# Patient Record
Sex: Female | Born: 1940 | Race: White | Hispanic: No | Marital: Married | Smoking: Never smoker
Health system: Southern US, Community
[De-identification: ages and names within clinical notes are randomized; demographics above are authoritative.]

## PROBLEM LIST (undated history)

## (undated) DIAGNOSIS — C50919 Malignant neoplasm of unspecified site of unspecified female breast: Secondary | ICD-10-CM

## (undated) DIAGNOSIS — E119 Type 2 diabetes mellitus without complications: Secondary | ICD-10-CM

## (undated) DIAGNOSIS — Z87442 Personal history of urinary calculi: Secondary | ICD-10-CM

## (undated) DIAGNOSIS — C801 Malignant (primary) neoplasm, unspecified: Secondary | ICD-10-CM

## (undated) DIAGNOSIS — K219 Gastro-esophageal reflux disease without esophagitis: Secondary | ICD-10-CM

## (undated) DIAGNOSIS — F419 Anxiety disorder, unspecified: Secondary | ICD-10-CM

## (undated) DIAGNOSIS — I10 Essential (primary) hypertension: Secondary | ICD-10-CM

## (undated) DIAGNOSIS — Z923 Personal history of irradiation: Secondary | ICD-10-CM

## (undated) DIAGNOSIS — R0600 Dyspnea, unspecified: Secondary | ICD-10-CM

## (undated) HISTORY — DX: Gastro-esophageal reflux disease without esophagitis: K21.9

## (undated) HISTORY — DX: Type 2 diabetes mellitus without complications: E11.9

## (undated) HISTORY — DX: Anxiety disorder, unspecified: F41.9

## (undated) HISTORY — PX: SKIN CANCER EXCISION: SHX779

## (undated) HISTORY — DX: Essential (primary) hypertension: I10

## (undated) HISTORY — PX: ABDOMINAL HYSTERECTOMY: SHX81

## (undated) HISTORY — PX: COLONOSCOPY WITH PROPOFOL: SHX5780

## (undated) HISTORY — PX: EYE SURGERY: SHX253

## (undated) HISTORY — PX: ACHILLES TENDON REPAIR: SUR1153

---
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
## 2003-03-01 ENCOUNTER — Other Ambulatory Visit: Payer: Self-pay

## 2005-04-14 ENCOUNTER — Ambulatory Visit: Payer: Self-pay | Admitting: Family Medicine

## 2005-07-07 ENCOUNTER — Ambulatory Visit: Payer: Self-pay | Admitting: Gastroenterology

## 2006-06-17 ENCOUNTER — Ambulatory Visit: Payer: Self-pay | Admitting: Family Medicine

## 2007-06-21 ENCOUNTER — Ambulatory Visit: Payer: Self-pay | Admitting: Family Medicine

## 2007-08-16 ENCOUNTER — Other Ambulatory Visit: Payer: Self-pay

## 2007-08-16 ENCOUNTER — Ambulatory Visit: Payer: Self-pay | Admitting: Ophthalmology

## 2007-08-29 ENCOUNTER — Ambulatory Visit: Payer: Self-pay | Admitting: Ophthalmology

## 2008-08-07 ENCOUNTER — Ambulatory Visit: Payer: Self-pay | Admitting: Family Medicine

## 2008-11-05 ENCOUNTER — Ambulatory Visit: Payer: Self-pay | Admitting: Gastroenterology

## 2010-07-01 ENCOUNTER — Ambulatory Visit: Payer: Self-pay | Admitting: Family Medicine

## 2011-07-22 ENCOUNTER — Ambulatory Visit: Payer: Self-pay | Admitting: Family Medicine

## 2012-10-03 ENCOUNTER — Ambulatory Visit: Payer: Self-pay | Admitting: Family Medicine

## 2012-10-06 ENCOUNTER — Ambulatory Visit: Payer: Self-pay | Admitting: Family Medicine

## 2012-11-02 ENCOUNTER — Ambulatory Visit: Payer: Self-pay | Admitting: Cardiology

## 2012-11-02 DIAGNOSIS — Z9889 Other specified postprocedural states: Secondary | ICD-10-CM | POA: Insufficient documentation

## 2013-02-21 ENCOUNTER — Ambulatory Visit: Payer: Self-pay | Admitting: Ophthalmology

## 2013-03-06 ENCOUNTER — Ambulatory Visit: Payer: Self-pay | Admitting: Ophthalmology

## 2013-10-26 ENCOUNTER — Ambulatory Visit: Payer: Self-pay | Admitting: Family Medicine

## 2013-11-17 DIAGNOSIS — Z86010 Personal history of colonic polyps: Secondary | ICD-10-CM | POA: Insufficient documentation

## 2013-12-04 ENCOUNTER — Ambulatory Visit: Payer: Self-pay | Admitting: Gastroenterology

## 2013-12-07 LAB — PATHOLOGY REPORT

## 2014-06-30 NOTE — Op Note (Signed)
PATIENT NAME:  Susan Montgomery, Susan Montgomery MR#:  [id number] DATE OF BIRTH:  1940-04-14  DATE OF PROCEDURE:  03/06/2013  PREOPERATIVE DIAGNOSIS: Cataract, left eye.   POSTOPERATIVE DIAGNOSIS: Cataract, left eye.   PROCEDURE PERFORMED: Extracapsular cataract extraction using phacoemulsification with placement of Alcon [id number], 14.5-diopter posterior chamber lens, serial number [id number].   SURGEON: Loura Back. Latroya Ng, M.D.   ANESTHESIA: Lidocaine 4% and 0.75% Marcaine, a 50-50 mixture with 10 units/mL of Hylenex added given as a peribulbar.   ANESTHESIOLOGIST: Dr. Boston Service.   COMPLICATIONS: None.   ESTIMATED BLOOD LOSS: Less than 1 mL.   DESCRIPTION OF PROCEDURE:  The patient was brought to the operating room and given a peribulbar block.  The patient was then prepped and draped in the usual fashion.  The vertical rectus muscles were imbricated using 5-0 silk sutures.  These sutures were then clamped to the sterile drapes as bridle sutures.  A limbal peritomy was performed extending two clock hours and hemostasis was obtained with cautery.  A partial thickness scleral groove was made at the surgical limbus and dissected anteriorly in a lamellar dissection using an Alcon crescent knife.  The anterior chamber was entered supero-temporally with a Superblade and through the lamellar dissection with a 2.6 mm keratome.  DisCoVisc was used to replace the aqueous and a continuous tear capsulorrhexis was carried out.  Hydrodissection and hydrodelineation were carried out with balanced salt and a 27 gauge canula.  The nucleus was rotated to confirm the effectiveness of the hydrodissection.  Phacoemulsification was carried out using a divide-and-conquer technique.  Total ultrasound time was 1 minute and 27.2 seconds with an average power of 25.5%.  CDE 36.98.    Irrigation/aspiration was used to remove the residual cortex.  DisCoVisc was used to inflate the capsule and the internal incision was enlarged to 3  mm with the crescent knife.  The intraocular lens was folded and inserted into the capsular bag using the AcrySert delivery system.  Irrigation/aspiration was used to remove the residual DisCoVisc.  Miostat was injected into the anterior chamber through the paracentesis track to inflate the anterior chamber and induce miosis.  The wound was checked for leaks and none were found. The conjunctiva was closed with cautery and the bridle sutures were removed.  Two drops of 0.3% Vigamox were placed on the eye.   An eye shield was placed on the eye.  The patient was discharged to the recovery room in good condition.  ____________________________ Loura Back Jaque Dacy, MD sad:cs D: 03/06/2013 13:32:37 ET T: 03/06/2013 14:49:18 ET JOB#: [id number]  cc: Remo Lipps A. Tawnia Schirm, MD, <Dictator> Martie Lee MD ELECTRONICALLY SIGNED 03/13/2013 11:54

## 2014-10-12 ENCOUNTER — Other Ambulatory Visit: Payer: Self-pay | Admitting: Family Medicine

## 2014-10-12 DIAGNOSIS — Z1231 Encounter for screening mammogram for malignant neoplasm of breast: Secondary | ICD-10-CM

## 2014-10-29 ENCOUNTER — Ambulatory Visit
Admission: RE | Admit: 2014-10-29 | Discharge: 2014-10-29 | Disposition: A | Discharge status: Home / Self Care | Payer: Medicare Other | Source: Ambulatory Visit | Attending: Family Medicine | Admitting: Family Medicine

## 2014-10-29 DIAGNOSIS — Z1231 Encounter for screening mammogram for malignant neoplasm of breast: Secondary | ICD-10-CM | POA: Insufficient documentation

## 2014-10-29 HISTORY — DX: Malignant (primary) neoplasm, unspecified: C80.1

## 2015-04-15 DIAGNOSIS — I10 Essential (primary) hypertension: Secondary | ICD-10-CM | POA: Insufficient documentation

## 2015-04-15 DIAGNOSIS — R002 Palpitations: Secondary | ICD-10-CM | POA: Insufficient documentation

## 2015-04-15 DIAGNOSIS — R0602 Shortness of breath: Secondary | ICD-10-CM | POA: Insufficient documentation

## 2015-09-18 ENCOUNTER — Other Ambulatory Visit: Payer: Self-pay | Admitting: Family Medicine

## 2015-09-18 DIAGNOSIS — Z1231 Encounter for screening mammogram for malignant neoplasm of breast: Secondary | ICD-10-CM

## 2015-10-30 ENCOUNTER — Other Ambulatory Visit: Payer: Self-pay | Admitting: Family Medicine

## 2015-10-30 ENCOUNTER — Ambulatory Visit
Admission: RE | Admit: 2015-10-30 | Discharge: 2015-10-30 | Disposition: A | Discharge status: Home / Self Care | Payer: Medicare HMO | Source: Ambulatory Visit | Attending: Family Medicine | Admitting: Family Medicine

## 2015-10-30 DIAGNOSIS — Z1231 Encounter for screening mammogram for malignant neoplasm of breast: Secondary | ICD-10-CM

## 2016-10-19 ENCOUNTER — Other Ambulatory Visit: Payer: Self-pay | Admitting: Family Medicine

## 2016-10-19 DIAGNOSIS — N6320 Unspecified lump in the left breast, unspecified quadrant: Principal | ICD-10-CM

## 2016-10-19 DIAGNOSIS — N6325 Unspecified lump in the left breast, overlapping quadrants: Secondary | ICD-10-CM

## 2016-10-29 ENCOUNTER — Ambulatory Visit
Admission: RE | Admit: 2016-10-29 | Discharge: 2016-10-29 | Disposition: A | Discharge status: Home / Self Care | Payer: Medicare HMO | Source: Ambulatory Visit | Attending: Family Medicine | Admitting: Family Medicine

## 2016-10-29 DIAGNOSIS — N6320 Unspecified lump in the left breast, unspecified quadrant: Secondary | ICD-10-CM | POA: Diagnosis not present

## 2016-10-29 DIAGNOSIS — N6325 Unspecified lump in the left breast, overlapping quadrants: Secondary | ICD-10-CM

## 2017-04-21 DIAGNOSIS — N393 Stress incontinence (female) (male): Secondary | ICD-10-CM | POA: Diagnosis not present

## 2017-04-21 DIAGNOSIS — E119 Type 2 diabetes mellitus without complications: Secondary | ICD-10-CM | POA: Diagnosis not present

## 2017-04-21 DIAGNOSIS — R5381 Other malaise: Secondary | ICD-10-CM | POA: Diagnosis not present

## 2017-04-21 DIAGNOSIS — I10 Essential (primary) hypertension: Secondary | ICD-10-CM | POA: Diagnosis not present

## 2017-04-21 DIAGNOSIS — R5383 Other fatigue: Secondary | ICD-10-CM | POA: Diagnosis not present

## 2017-04-21 DIAGNOSIS — R300 Dysuria: Secondary | ICD-10-CM | POA: Diagnosis not present

## 2017-04-21 DIAGNOSIS — L989 Disorder of the skin and subcutaneous tissue, unspecified: Secondary | ICD-10-CM | POA: Diagnosis not present

## 2017-06-07 DIAGNOSIS — L538 Other specified erythematous conditions: Secondary | ICD-10-CM | POA: Diagnosis not present

## 2017-06-07 DIAGNOSIS — X32XXXA Exposure to sunlight, initial encounter: Secondary | ICD-10-CM | POA: Diagnosis not present

## 2017-06-07 DIAGNOSIS — L817 Pigmented purpuric dermatosis: Secondary | ICD-10-CM | POA: Diagnosis not present

## 2017-06-07 DIAGNOSIS — L570 Actinic keratosis: Secondary | ICD-10-CM | POA: Diagnosis not present

## 2017-06-07 DIAGNOSIS — L820 Inflamed seborrheic keratosis: Secondary | ICD-10-CM | POA: Diagnosis not present

## 2017-07-12 DIAGNOSIS — H401131 Primary open-angle glaucoma, bilateral, mild stage: Secondary | ICD-10-CM | POA: Diagnosis not present

## 2017-07-19 DIAGNOSIS — H401131 Primary open-angle glaucoma, bilateral, mild stage: Secondary | ICD-10-CM | POA: Diagnosis not present

## 2017-10-11 DIAGNOSIS — E119 Type 2 diabetes mellitus without complications: Secondary | ICD-10-CM | POA: Diagnosis not present

## 2017-10-11 DIAGNOSIS — I10 Essential (primary) hypertension: Secondary | ICD-10-CM | POA: Diagnosis not present

## 2017-10-21 DIAGNOSIS — M25512 Pain in left shoulder: Secondary | ICD-10-CM | POA: Diagnosis not present

## 2017-10-21 DIAGNOSIS — E119 Type 2 diabetes mellitus without complications: Secondary | ICD-10-CM | POA: Diagnosis not present

## 2017-10-21 DIAGNOSIS — I10 Essential (primary) hypertension: Secondary | ICD-10-CM | POA: Diagnosis not present

## 2017-10-21 DIAGNOSIS — Z0000 Encounter for general adult medical examination without abnormal findings: Secondary | ICD-10-CM | POA: Diagnosis not present

## 2017-10-22 ENCOUNTER — Other Ambulatory Visit: Payer: Self-pay | Admitting: Family Medicine

## 2017-10-22 DIAGNOSIS — Z1231 Encounter for screening mammogram for malignant neoplasm of breast: Secondary | ICD-10-CM

## 2017-10-29 DIAGNOSIS — D2271 Melanocytic nevi of right lower limb, including hip: Secondary | ICD-10-CM | POA: Diagnosis not present

## 2017-10-29 DIAGNOSIS — D2262 Melanocytic nevi of left upper limb, including shoulder: Secondary | ICD-10-CM | POA: Diagnosis not present

## 2017-10-29 DIAGNOSIS — Z08 Encounter for follow-up examination after completed treatment for malignant neoplasm: Secondary | ICD-10-CM | POA: Diagnosis not present

## 2017-10-29 DIAGNOSIS — D225 Melanocytic nevi of trunk: Secondary | ICD-10-CM | POA: Diagnosis not present

## 2017-10-29 DIAGNOSIS — D2272 Melanocytic nevi of left lower limb, including hip: Secondary | ICD-10-CM | POA: Diagnosis not present

## 2017-10-29 DIAGNOSIS — L821 Other seborrheic keratosis: Secondary | ICD-10-CM | POA: Diagnosis not present

## 2017-10-29 DIAGNOSIS — Z85828 Personal history of other malignant neoplasm of skin: Secondary | ICD-10-CM | POA: Diagnosis not present

## 2017-10-29 DIAGNOSIS — D2261 Melanocytic nevi of right upper limb, including shoulder: Secondary | ICD-10-CM | POA: Diagnosis not present

## 2017-11-10 ENCOUNTER — Encounter: Payer: Self-pay | Admitting: Radiology

## 2017-11-10 ENCOUNTER — Ambulatory Visit
Admission: RE | Admit: 2017-11-10 | Discharge: 2017-11-10 | Disposition: A | Discharge status: Home / Self Care | Payer: Medicare HMO | Source: Ambulatory Visit | Attending: Family Medicine | Admitting: Family Medicine

## 2017-11-10 DIAGNOSIS — Z1231 Encounter for screening mammogram for malignant neoplasm of breast: Secondary | ICD-10-CM | POA: Insufficient documentation

## 2017-11-15 ENCOUNTER — Other Ambulatory Visit: Payer: Self-pay | Admitting: Family Medicine

## 2017-11-15 DIAGNOSIS — R921 Mammographic calcification found on diagnostic imaging of breast: Secondary | ICD-10-CM

## 2017-11-15 DIAGNOSIS — R928 Other abnormal and inconclusive findings on diagnostic imaging of breast: Secondary | ICD-10-CM

## 2017-11-23 ENCOUNTER — Other Ambulatory Visit: Payer: Medicare Other

## 2017-11-23 ENCOUNTER — Ambulatory Visit
Admission: RE | Admit: 2017-11-23 | Discharge: 2017-11-23 | Disposition: A | Discharge status: Home / Self Care | Payer: Medicare Other | Source: Ambulatory Visit | Attending: Family Medicine | Admitting: Family Medicine

## 2017-12-01 ENCOUNTER — Ambulatory Visit
Admission: RE | Admit: 2017-12-01 | Discharge: 2017-12-01 | Disposition: A | Discharge status: Home / Self Care | Payer: Medicare HMO | Source: Ambulatory Visit | Attending: Family Medicine | Admitting: Family Medicine

## 2017-12-01 DIAGNOSIS — R928 Other abnormal and inconclusive findings on diagnostic imaging of breast: Secondary | ICD-10-CM | POA: Diagnosis not present

## 2017-12-01 DIAGNOSIS — R921 Mammographic calcification found on diagnostic imaging of breast: Secondary | ICD-10-CM | POA: Insufficient documentation

## 2017-12-02 ENCOUNTER — Other Ambulatory Visit: Payer: Self-pay | Admitting: Family Medicine

## 2017-12-02 DIAGNOSIS — R928 Other abnormal and inconclusive findings on diagnostic imaging of breast: Secondary | ICD-10-CM

## 2017-12-02 DIAGNOSIS — R921 Mammographic calcification found on diagnostic imaging of breast: Secondary | ICD-10-CM

## 2017-12-07 DIAGNOSIS — C50919 Malignant neoplasm of unspecified site of unspecified female breast: Secondary | ICD-10-CM

## 2017-12-07 HISTORY — DX: Malignant neoplasm of unspecified site of unspecified female breast: C50.919

## 2017-12-09 ENCOUNTER — Ambulatory Visit
Admission: RE | Admit: 2017-12-09 | Discharge: 2017-12-09 | Disposition: A | Discharge status: Home / Self Care | Payer: Medicare HMO | Source: Ambulatory Visit | Attending: Family Medicine | Admitting: Family Medicine

## 2017-12-09 DIAGNOSIS — D0511 Intraductal carcinoma in situ of right breast: Secondary | ICD-10-CM | POA: Diagnosis not present

## 2017-12-09 DIAGNOSIS — R921 Mammographic calcification found on diagnostic imaging of breast: Secondary | ICD-10-CM | POA: Insufficient documentation

## 2017-12-09 DIAGNOSIS — R928 Other abnormal and inconclusive findings on diagnostic imaging of breast: Secondary | ICD-10-CM

## 2017-12-09 HISTORY — PX: BREAST BIOPSY: SHX20

## 2017-12-10 ENCOUNTER — Other Ambulatory Visit: Payer: Self-pay | Admitting: Family Medicine

## 2017-12-13 LAB — SURGICAL PATHOLOGY

## 2017-12-14 ENCOUNTER — Other Ambulatory Visit: Payer: Self-pay

## 2017-12-14 DIAGNOSIS — D0511 Intraductal carcinoma in situ of right breast: Secondary | ICD-10-CM

## 2017-12-14 NOTE — Progress Notes (Signed)
  Oncology Nurse Navigator Documentation  Navigator Location: [facility] (12/14/17 1600) Referral date to RadOnc/MedOnc: 12/21/17 (12/14/17 1600) )Navigator Encounter Type: Introductory phone call (12/14/17 1600)   Abnormal Finding Date: 12/01/17 (12/14/17 1600) Confirmed Diagnosis Date: 12/09/17 (12/14/17 1600)               Patient Visit Type: Initial (12/14/17 1600) Treatment Phase: Pre-Tx/Tx Discussion (12/14/17 1600) Barriers/Navigation Needs: Education;Coordination of Care (12/14/17 1600) Education: Accessing Care/ Finding Providers;Coping with Diagnosis/ Prognosis;Newly Diagnosed Cancer Education (12/14/17 1600) Interventions: Coordination of Care;Education (12/14/17 1600)   Coordination of Care: Appts (12/14/17 1600)                  Time Spent with Patient: 60 (12/14/17 1600)   Introduced to [employer].  To give Breast Cancer Treatment Handbook/folder with hospital services at initial consult with Dr. Janese Banks on 12/21/17.  Patient is also scheduled for surgical consult with Dr. Peyton Najjar on 12/21/17 a 1:30.

## 2017-12-16 ENCOUNTER — Other Ambulatory Visit: Payer: Self-pay

## 2017-12-21 ENCOUNTER — Ambulatory Visit: Payer: Self-pay | Admitting: General Surgery

## 2017-12-21 ENCOUNTER — Other Ambulatory Visit: Payer: Self-pay | Admitting: General Surgery

## 2017-12-21 ENCOUNTER — Inpatient Hospital Stay: Payer: Medicare HMO | Attending: Oncology | Admitting: Oncology

## 2017-12-21 ENCOUNTER — Encounter (INDEPENDENT_AMBULATORY_CARE_PROVIDER_SITE_OTHER): Payer: Self-pay

## 2017-12-21 ENCOUNTER — Encounter: Payer: Self-pay | Admitting: Oncology

## 2017-12-21 VITALS — BP 123/78 | HR 76 | Temp 97.8°F | Resp 18 | Ht 64.0 in | Wt 189.9 lb

## 2017-12-21 DIAGNOSIS — D0511 Intraductal carcinoma in situ of right breast: Secondary | ICD-10-CM | POA: Diagnosis not present

## 2017-12-21 DIAGNOSIS — Z803 Family history of malignant neoplasm of breast: Secondary | ICD-10-CM | POA: Insufficient documentation

## 2017-12-21 DIAGNOSIS — I10 Essential (primary) hypertension: Secondary | ICD-10-CM | POA: Diagnosis not present

## 2017-12-21 DIAGNOSIS — Z7189 Other specified counseling: Secondary | ICD-10-CM

## 2017-12-21 DIAGNOSIS — M8580 Other specified disorders of bone density and structure, unspecified site: Secondary | ICD-10-CM | POA: Diagnosis not present

## 2017-12-21 DIAGNOSIS — N649 Disorder of breast, unspecified: Secondary | ICD-10-CM | POA: Diagnosis not present

## 2017-12-21 NOTE — Progress Notes (Signed)
  Oncology Nurse Navigator Documentation  Navigator Location: [facility] (12/21/17 1500) Referral date to RadOnc/MedOnc: 12/21/17 (12/21/17 1500) )Navigator Encounter Type: Clinic/MDC (12/21/17 1500)       Surgery Date: 12/29/17 (12/21/17 1500)             Patient Visit Type: Follow-up (12/21/17 1500) Treatment Phase: Pre-Tx/Tx Discussion (12/21/17 1500) Barriers/Navigation Needs: Education;Coordination of Care (12/21/17 1500) Education: Newly Diagnosed Cancer Education;Coping with Diagnosis/ Prognosis;Understanding Cancer/ Treatment Options (12/21/17 1500)                        Time Spent with Patient: 60 (12/21/17 1500)   Supported patient at initial Med/Onc visit. States she is very anxious.  Dr. Janese Banks alleviated anxiety with discussion of treatment plan.  Patient saw Dr. Peyton Najjar this afternoon and she is scheduled for surgery on 12/29/17.

## 2017-12-21 NOTE — H&P (View-Only) (Signed)
PATIENT PROFILE: Susan Montgomery is a 77 y.o. female who presents to the Clinic for consultation at the request of Dr. Hedrick for evaluation of breast cancer.  PCP:  Hedrick, James F, MD  HISTORY OF PRESENT ILLNESS: Susan Montgomery reports had her regular screening mammogram. She denies having breast pain, skin changes, nipple retraction or secretions.   Patient had screening mammogram on 11/10/17 and was found with suspicious calcifications. This was again demonstrated and confirmed in the diagnostic mammogram on 12/01/17. Biopsy with clip placement was done on 12/09/17. 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   Family history of breast cancer: Aunt from father side Family history of other cancers: Brother with liver cancer Menarche: 14 years old Menopause: When she was in the 40's Used OCP: yes for 12 years Number of pregnancies: 3 Used estrogen and progesterone therapy: Premarin for several years  History of Radiation to the chest: None  PROBLEM LIST:         Problem List  Date Reviewed: 10/21/2017         Noted   SOB (shortness of breath) on exertion 04/15/2015   Essential hypertension 04/15/2015   Heart palpitations 04/15/2015   Hx of adenomatous colonic polyps 11/17/2013   Osteopenia Unknown   H/O cardiac catheterization 11/02/2012   Overview    Insignificant CAD         GENERAL REVIEW OF SYSTEMS:   General ROS: negative for - chills, fatigue, fever, weight gain or weight loss Allergy and Immunology ROS: negative for - hives  Hematological and Lymphatic ROS: negative for - bleeding problems or bruising, negative for palpable nodes Endocrine ROS: negative for - heat or cold intolerance, hair changes Respiratory ROS: negative for - cough, or wheezing. Positive for shortness of breath.  Cardiovascular ROS: no chest pain or palpitations GI ROS: negative for nausea, vomiting, abdominal pain, diarrhea. Positive for  constipation Musculoskeletal ROS: negative for - joint swelling or muscle pain. Positive for joint stiffness Neurological ROS: negative for - confusion, syncope Dermatological ROS: negative for pruritus and rash Psychiatric: negative for depression, difficulty sleeping and memory loss. Positive for anxiety.   MEDICATIONS: CurrentMedications        Current Outpatient Medications  Medication Sig Dispense Refill  . citalopram (CELEXA) 20 MG tablet TAKE 1 TABLET BY MOUTH ONCE DAILY 90 tablet 2  . fluticasone (FLONASE) 50 mcg/actuation nasal spray Place 2 sprays into both nostrils once daily 16 g 1  . latanoprost (XALATAN) 0.005 % ophthalmic solution 1 drop nightly.    . meloxicam (MOBIC) 15 MG tablet Take 1 tablet (15 mg total) by mouth once daily 30 tablet 0  . metoprolol succinate (TOPROL-XL) 25 MG XL tablet TAKE ONE TABLET BY MOUTH ONCE DAILY 90 tablet 3  . omeprazole (PRILOSEC) 20 MG DR capsule TAKE ONE CAPSULE BY MOUTH ONCE DAILY 90 capsule 3  . triamterene-hydrochlorothiazide (MAXZIDE) 75-50 mg tablet TAKE 1 TABLET BY MOUTH ONCE DAILY 90 tablet 3   No current facility-administered medications for this visit.       ALLERGIES: Codeine phosphate; Penicillin g pot in dextrose; and Sulfa (sulfonamide antibiotics)  PAST MEDICAL HISTORY:     Past Medical History:  Diagnosis Date  . Arthritis   . Breast cancer (CMS-HCC) 12/2017  . Colon polyp 12/04/13   TUBULAR ADENOMA AND HYPERPLASTIC  . Diverticulosis 12/04/13  . GERD (gastroesophageal reflux disease)   . Glaucoma (increased eye pressure)    Dr. Dingeldein  . Hypertension   .   Osteopenia     PAST SURGICAL HISTORY:      Past Surgical History:  Procedure Laterality Date  . CATARACT EXTRACTION     right 2011, left 2014  . COLONOSCOPY  1999  . COLONOSCOPY  06/11/1998  . COLONOSCOPY  04/28/2001  . COLONOSCOPY  07/07/2005  . COLONOSCOPY  11/05/2008  . COLONOSCOPY  12/04/13   repeat 5 years per mus   . HYSTERECTOMY  1986   for bleeding     FAMILY HISTORY:      Family History  Problem Relation Age of Onset  . Diabetes type II Mother   . Myocardial Infarction (Heart attack) Mother   . High blood pressure (Hypertension) Mother   . High blood pressure (Hypertension) Sister   . Kidney cancer Brother   . Diabetes type II Sister   . Colon cancer Neg Hx   . Colon polyps Neg Hx   . Rectal cancer Neg Hx   . Ulcers Neg Hx      SOCIAL HISTORY: Social History          Socioeconomic History  . Marital status: Married    Spouse name: Not on file  . Number of children: Not on file  . Years of education: Not on file  . Highest education level: Not on file  Occupational History  . Not on file  Social Needs  . Financial resource strain: Not on file  . Food insecurity:    Worry: Not on file    Inability: Not on file  . Transportation needs:    Medical: Not on file    Non-medical: Not on file  Tobacco Use  . Smoking status: Never Smoker  . Smokeless tobacco: Never Used  Substance and Sexual Activity  . Alcohol use: No    Alcohol/week: 0.0 standard drinks  . Drug use: No  . Sexual activity: Defer  Other Topics Concern  . Not on file  Social History Narrative   She is married, has three children age 39 to 41. Occupation:  sewing supervisor.  No tobacco or alcohol. No illicit drugs.      PHYSICAL EXAM:    Vitals:   12/21/17 1314  BP: 125/73  Pulse: 72  Temp: 36.4 C (97.5 F)    Body mass index is 31.94 kg/m. Weight: 84.4 kg (186 lb 1.1 oz)   GENERAL: Alert, active, oriented x3  HEENT: Pupils equal reactive to light. Extraocular movements are intact. Sclera clear. Palpebral conjunctiva normal red color.Pharynx clear.  NECK: Supple with no palpable mass and no adenopathy.  LUNGS: Sound clear with no rales rhonchi or wheezes.  HEART: Regular rhythm S1 and S2 without murmur.  BREAST: right breast normal without mass,  skin or nipple changes or axillary nodes. There is a 1.5 cm skin lesion, pedunculated on the lower inner area. Small bruise on lower outer area from biopsy site. Left breast normal without mass, skin or nipple changes or axillary nodes.  ABDOMEN: Soft and depressible, nontender with no palpable mass, no hepatomegaly.  EXTREMITIES: Well-developed well-nourished symmetrical with no dependent edema.  NEUROLOGICAL: Awake alert oriented, facial expression symmetrical, moving all extremities.  REVIEW OF DATA: I have reviewed the following data today:      Appointment on 10/11/2017  Component Date Value  . WBC (White Blood Cell Co* 10/11/2017 8.9   . RBC (Red Blood Cell Coun* 10/11/2017 4.95   . Hemoglobin 10/11/2017 13.3   . Hematocrit 10/11/2017 41.6   . MCV (Mean Corpuscular Vo* 10/11/2017   84.0   . MCH (Mean Corpuscular He* 10/11/2017 26.9*  . MCHC (Mean Corpuscular H* 10/11/2017 32.0   . Platelet Count 10/11/2017 343   . RDW-CV (Red Cell Distrib* 10/11/2017 13.0   . MPV (Mean Platelet Volum* 10/11/2017 10.4   . Neutrophils 10/11/2017 5.50   . Lymphocytes 10/11/2017 2.60   . Mixed Count 10/11/2017 0.80   . Neutrophil % 10/11/2017 62.2   . Lymphocyte % 10/11/2017 29.2   . Mixed % 10/11/2017 8.6   . Glucose 10/11/2017 157*  . Sodium 10/11/2017 141   . Potassium 10/11/2017 3.7   . Chloride 10/11/2017 100   . Carbon Dioxide (CO2) 10/11/2017 31.9   . Urea Nitrogen (BUN) 10/11/2017 29*  . Creatinine 10/11/2017 0.9   . Glomerular Filtration Ra* 10/11/2017 61   . Calcium 10/11/2017 9.6   . AST  10/11/2017 13   . ALT  10/11/2017 10   . Alk Phos (alkaline Phosp* 10/11/2017 62   . Albumin 10/11/2017 4.1   . Bilirubin, Total 10/11/2017 0.7   . Protein, Total 10/11/2017 7.4   . A/G Ratio 10/11/2017 1.2   . Cholesterol, Total 10/11/2017 203*  . Triglyceride 10/11/2017 125   . HDL (High Density Lipopr* 10/11/2017 38.3   . LDL (Low Density Lipopro* 10/11/2017 140*  . VLDL Cholesterol  10/11/2017 25   . Cholesterol/HDL Ratio 10/11/2017 5.3   . Color 10/11/2017 Yellow   . Clarity 10/11/2017 SL Cloudy*  . Specific Gravity 10/11/2017 1.015   . pH, Urine 10/11/2017 5.5   . Protein, Urinalysis 10/11/2017 Negative   . Glucose, Urinalysis 10/11/2017 Negative   . Ketones, Urinalysis 10/11/2017 Negative   . Blood, Urinalysis 10/11/2017 Negative   . Nitrite, Urinalysis 10/11/2017 Negative   . Leukocyte Esterase, Urin* 10/11/2017 Negative   . White Blood Cells, Urina* 10/11/2017 0-3   . Red Blood Cells, Urinaly* 10/11/2017 0-3   . Bacteria, Urinalysis 10/11/2017 Many*  . Squamous Epithelial Cell* 10/11/2017 Many*  . Hemoglobin A1C 10/11/2017 7.3*  . Average Blood Glucose (C* 10/11/2017 163     I personally reviewed the images screening, diagnostic and post clip placement mammograms identifying the cluster of calcifications with a span of 21 x 6 mm on the right breast.  ASSESSMENT: Susan Montgomery is a 77 y.o. female presenting for consultation for right breast cancer.    Patient was oriented again about the pathology results. Report showing DCIS discussed with patient. Surgical alternatives were discussed with patient including partial vs total mastectomy. Surgical technique and post operative care was discussed with patient. Risk of surgery was discussed with patient including but not limited to: wound infection, seroma, hematoma, brachial plexopathy, mondor's disease (thrombosis of small veins of breast), chronic wound pain, breast lymphedema, altered sensation to the nipple and cosmesis among others.   PLAN: 1. Needle guided right partial mastectomy (19301), Excision of right breast skin lesion (11402) 2. CBC, CMP done on 10/11/17 3. Internal Medicine clearance (Dr. Kary Kos).  4. Avoid aspirin 5 days before surgery.  5. Contact us if has any question or concern.   Patient and her husband verbalized understanding, all questions were answered, and were agreeable with the plan  outlined above.     Herbert Pun, MD

## 2017-12-21 NOTE — H&P (Signed)
PATIENT PROFILE: Susan Montgomery is a 77 y.o. female who presents to the Clinic for consultation at the request of Dr. Kary Montgomery for evaluation of breast cancer.  PCP:  Susan Macadamia, MD  HISTORY OF PRESENT ILLNESS: Susan Montgomery reports had her regular screening mammogram. She denies having breast pain, skin changes, nipple retraction or secretions.   Patient had screening mammogram on 11/10/17 and was found with suspicious calcifications. This was again demonstrated and confirmed in the diagnostic mammogram on 12/01/17. Biopsy with clip placement was done on 12/09/17. 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   Family history of breast cancer: Aunt from father side Family history of other cancers: Brother with liver cancer Menarche: 65 years old Menopause: When she was in the 56's Used OCP: yes for 12 years Number of pregnancies: 3 Used estrogen and progesterone therapy: Premarin for several years  History of Radiation to the chest: None  PROBLEM LIST:         Problem List  Date Reviewed: 10/21/2017         Noted   SOB (shortness of breath) on exertion 04/15/2015   Essential hypertension 04/15/2015   Heart palpitations 04/15/2015   Hx of adenomatous colonic polyps 11/17/2013   Osteopenia Unknown   H/O cardiac catheterization 11/02/2012   Overview    Insignificant CAD         GENERAL REVIEW OF SYSTEMS:   General ROS: negative for - chills, fatigue, fever, weight gain or weight loss Allergy and Immunology ROS: negative for - hives  Hematological and Lymphatic ROS: negative for - bleeding problems or bruising, negative for palpable nodes Endocrine ROS: negative for - heat or cold intolerance, hair changes Respiratory ROS: negative for - cough, or wheezing. Positive for shortness of breath.  Cardiovascular ROS: no chest pain or palpitations GI ROS: negative for nausea, vomiting, abdominal pain, diarrhea. Positive for  constipation Musculoskeletal ROS: negative for - joint swelling or muscle pain. Positive for joint stiffness Neurological ROS: negative for - confusion, syncope Dermatological ROS: negative for pruritus and rash Psychiatric: negative for depression, difficulty sleeping and memory loss. Positive for anxiety.   MEDICATIONS: CurrentMedications        Current Outpatient Medications  Medication Sig Dispense Refill  . citalopram (CELEXA) 20 MG tablet TAKE 1 TABLET BY MOUTH ONCE DAILY 90 tablet 2  . fluticasone (FLONASE) 50 mcg/actuation nasal spray Place 2 sprays into both nostrils once daily 16 g 1  . latanoprost (XALATAN) 0.005 % ophthalmic solution 1 drop nightly.    . meloxicam (MOBIC) 15 MG tablet Take 1 tablet (15 mg total) by mouth once daily 30 tablet 0  . metoprolol succinate (TOPROL-XL) 25 MG XL tablet TAKE ONE TABLET BY MOUTH ONCE DAILY 90 tablet 3  . omeprazole (PRILOSEC) 20 MG DR capsule TAKE ONE CAPSULE BY MOUTH ONCE DAILY 90 capsule 3  . triamterene-hydrochlorothiazide (MAXZIDE) 75-50 mg tablet TAKE 1 TABLET BY MOUTH ONCE DAILY 90 tablet 3   No current facility-administered medications for this visit.       ALLERGIES: Codeine phosphate; Penicillin g pot in dextrose; and Sulfa (sulfonamide antibiotics)  PAST MEDICAL HISTORY:     Past Medical History:  Diagnosis Date  . Arthritis   . Breast cancer (CMS-HCC) 12/2017  . Colon polyp 12/04/13   TUBULAR ADENOMA AND HYPERPLASTIC  . Diverticulosis 12/04/13  . GERD (gastroesophageal reflux disease)   . Glaucoma (increased eye pressure)    Dr. Sandra Montgomery  . Hypertension   .  Osteopenia     PAST SURGICAL HISTORY:      Past Surgical History:  Procedure Laterality Date  . CATARACT EXTRACTION     right 2011, left 2014  . COLONOSCOPY  1999  . COLONOSCOPY  06/11/1998  . COLONOSCOPY  04/28/2001  . COLONOSCOPY  07/07/2005  . COLONOSCOPY  11/05/2008  . COLONOSCOPY  12/04/13   repeat 5 years per mus   . HYSTERECTOMY  1986   for bleeding     FAMILY HISTORY:      Family History  Problem Relation Age of Onset  . Diabetes type II Mother   . Myocardial Infarction (Heart attack) Mother   . High blood pressure (Hypertension) Mother   . High blood pressure (Hypertension) Sister   . Kidney cancer Brother   . Diabetes type II Sister   . Colon cancer Neg Hx   . Colon polyps Neg Hx   . Rectal cancer Neg Hx   . Ulcers Neg Hx      SOCIAL HISTORY: Social History          Socioeconomic History  . Marital status: Married    Spouse name: Not on file  . Number of children: Not on file  . Years of education: Not on file  . Highest education level: Not on file  Occupational History  . Not on file  Social Needs  . Financial resource strain: Not on file  . Food insecurity:    Worry: Not on file    Inability: Not on file  . Transportation needs:    Medical: Not on file    Non-medical: Not on file  Tobacco Use  . Smoking status: Never Smoker  . Smokeless tobacco: Never Used  Substance and Sexual Activity  . Alcohol use: No    Alcohol/week: 0.0 standard drinks  . Drug use: No  . Sexual activity: Defer  Other Topics Concern  . Not on file  Social History Narrative   She is married, has three children age 46 to 48. Occupation:  Designer, multimedia.  No tobacco or alcohol. No illicit drugs.      PHYSICAL EXAM:    Vitals:   12/21/17 1314  BP: 125/73  Pulse: 72  Temp: 36.4 C (97.5 F)    Body mass index is 31.94 kg/m. Weight: 84.4 kg (186 lb 1.1 oz)   GENERAL: Alert, active, oriented x3  HEENT: Pupils equal reactive to light. Extraocular movements are intact. Sclera clear. Palpebral conjunctiva normal red color.Pharynx clear.  NECK: Supple with no palpable mass and no adenopathy.  LUNGS: Sound clear with no rales rhonchi or wheezes.  HEART: Regular rhythm S1 and S2 without murmur.  BREAST: right breast normal without mass,  skin or nipple changes or axillary nodes. There is a 1.5 cm skin lesion, pedunculated on the lower inner area. Small bruise on lower outer area from biopsy site. Left breast normal without mass, skin or nipple changes or axillary nodes.  ABDOMEN: Soft and depressible, nontender with no palpable mass, no hepatomegaly.  EXTREMITIES: Well-developed well-nourished symmetrical with no dependent edema.  NEUROLOGICAL: Awake alert oriented, facial expression symmetrical, moving all extremities.  REVIEW OF DATA: I have reviewed the following data today:      Appointment on 10/11/2017  Component Date Value  . WBC (White Blood Cell Co* 10/11/2017 8.9   . RBC (Red Blood Cell Coun* 10/11/2017 4.95   . Hemoglobin 10/11/2017 13.3   . Hematocrit 10/11/2017 41.6   . MCV (Mean Corpuscular Vo* 10/11/2017  84.0   . MCH (Mean Corpuscular He* 10/11/2017 26.9*  . MCHC (Mean Corpuscular H* 10/11/2017 32.0   . Platelet Count 10/11/2017 343   . RDW-CV (Red Cell Distrib* 10/11/2017 13.0   . MPV (Mean Platelet Volum* 10/11/2017 10.4   . Neutrophils 10/11/2017 5.50   . Lymphocytes 10/11/2017 2.60   . Mixed Count 10/11/2017 0.80   . Neutrophil % 10/11/2017 62.2   . Lymphocyte % 10/11/2017 29.2   . Mixed % 10/11/2017 8.6   . Glucose 10/11/2017 157*  . Sodium 10/11/2017 141   . Potassium 10/11/2017 3.7   . Chloride 10/11/2017 100   . Carbon Dioxide (CO2) 10/11/2017 31.9   . Urea Nitrogen (BUN) 10/11/2017 29*  . Creatinine 10/11/2017 0.9   . Glomerular Filtration Ra* 10/11/2017 61   . Calcium 10/11/2017 9.6   . AST  10/11/2017 13   . ALT  10/11/2017 10   . Alk Phos (alkaline Phosp* 10/11/2017 62   . Albumin 10/11/2017 4.1   . Bilirubin, Total 10/11/2017 0.7   . Protein, Total 10/11/2017 7.4   . A/G Ratio 10/11/2017 1.2   . Cholesterol, Total 10/11/2017 203*  . Triglyceride 10/11/2017 125   . HDL (High Density Lipopr* 10/11/2017 38.3   . LDL (Low Density Lipopro* 10/11/2017 140*  . VLDL Cholesterol  10/11/2017 25   . Cholesterol/HDL Ratio 10/11/2017 5.3   . Color 10/11/2017 Yellow   . Clarity 10/11/2017 SL Cloudy*  . Specific Gravity 10/11/2017 1.015   . pH, Urine 10/11/2017 5.5   . Protein, Urinalysis 10/11/2017 Negative   . Glucose, Urinalysis 10/11/2017 Negative   . Ketones, Urinalysis 10/11/2017 Negative   . Blood, Urinalysis 10/11/2017 Negative   . Nitrite, Urinalysis 10/11/2017 Negative   . Leukocyte Esterase, Urin* 10/11/2017 Negative   . White Blood Cells, Urina* 10/11/2017 0-3   . Red Blood Cells, Urinaly* 10/11/2017 0-3   . Bacteria, Urinalysis 10/11/2017 Many*  . Squamous Epithelial Cell* 10/11/2017 Many*  . Hemoglobin A1C 10/11/2017 7.3*  . Average Blood Glucose (C* 10/11/2017 163     I personally reviewed the images screening, diagnostic and post clip placement mammograms identifying the cluster of calcifications with a span of 21 x 6 mm on the right breast.  ASSESSMENT: Ms. Kilbride is a 77 y.o. female presenting for consultation for right breast cancer.    Patient was oriented again about the pathology results. Report showing DCIS discussed with patient. Surgical alternatives were discussed with patient including partial vs total mastectomy. Surgical technique and post operative care was discussed with patient. Risk of surgery was discussed with patient including but not limited to: wound infection, seroma, hematoma, brachial plexopathy, mondor's disease (thrombosis of small veins of breast), chronic wound pain, breast lymphedema, altered sensation to the nipple and cosmesis among others.   PLAN: 1. Needle guided right partial mastectomy (19301), Excision of right breast skin lesion (11402) 2. CBC, CMP done on 10/11/17 3. Internal Medicine clearance (Dr. Kary Montgomery).  4. Avoid aspirin 5 days before surgery.  5. Contact us if has any question or concern.   Patient and her husband verbalized understanding, all questions were answered, and were agreeable with the plan  outlined above.     Herbert Pun, MD

## 2017-12-22 ENCOUNTER — Telehealth: Payer: Self-pay | Admitting: Oncology

## 2017-12-22 ENCOUNTER — Ambulatory Visit: Payer: Self-pay | Admitting: General Surgery

## 2017-12-22 NOTE — Telephone Encounter (Signed)
FYI  MD follow up (same day as Dr. Baruch Gouty), per Dr. Rip Harbour msg, scheduled for 01/06/18 as per (Sx 12/29/17)  Unable to conf appts w patient as per called patient several times and got no answer on both home and mobile phones, also unable to leave a VM as per no V/M avail. Appt reminders for Dr. Baruch Gouty and Dr. Janese Banks mailed to patient.

## 2017-12-23 ENCOUNTER — Other Ambulatory Visit: Payer: Self-pay

## 2017-12-23 ENCOUNTER — Encounter
Admission: RE | Admit: 2017-12-23 | Discharge: 2017-12-23 | Disposition: A | Discharge status: Home / Self Care | Payer: Medicare HMO | Source: Ambulatory Visit | Attending: General Surgery | Admitting: General Surgery

## 2017-12-23 DIAGNOSIS — Z01818 Encounter for other preprocedural examination: Secondary | ICD-10-CM | POA: Insufficient documentation

## 2017-12-23 DIAGNOSIS — D4861 Neoplasm of uncertain behavior of right breast: Secondary | ICD-10-CM | POA: Insufficient documentation

## 2017-12-23 DIAGNOSIS — R0602 Shortness of breath: Secondary | ICD-10-CM | POA: Diagnosis not present

## 2017-12-23 DIAGNOSIS — I10 Essential (primary) hypertension: Secondary | ICD-10-CM | POA: Insufficient documentation

## 2017-12-23 DIAGNOSIS — D0511 Intraductal carcinoma in situ of right breast: Secondary | ICD-10-CM | POA: Insufficient documentation

## 2017-12-23 HISTORY — DX: Personal history of urinary calculi: Z87.442

## 2017-12-23 HISTORY — DX: Dyspnea, unspecified: R06.00

## 2017-12-23 LAB — CBC
HCT: 41.6 % (ref 36.0–46.0)
Hemoglobin: 13.4 g/dL (ref 12.0–15.0)
MCH: 26.5 pg (ref 26.0–34.0)
MCHC: 32.2 g/dL (ref 30.0–36.0)
MCV: 82.4 fL (ref 80.0–100.0)
NRBC: 0 % (ref 0.0–0.2)
PLATELETS: 337 10*3/uL (ref 150–400)
RBC: 5.05 MIL/uL (ref 3.87–5.11)
RDW: 13.4 % (ref 11.5–15.5)
WBC: 9.1 10*3/uL (ref 4.0–10.5)

## 2017-12-23 LAB — COMPREHENSIVE METABOLIC PANEL
ALK PHOS: 62 U/L (ref 38–126)
ALT: 12 U/L (ref 0–44)
AST: 17 U/L (ref 15–41)
Albumin: 4.1 g/dL (ref 3.5–5.0)
Anion gap: 10 (ref 5–15)
BILIRUBIN TOTAL: 0.5 mg/dL (ref 0.3–1.2)
BUN: 29 mg/dL — ABNORMAL HIGH (ref 8–23)
CALCIUM: 9.3 mg/dL (ref 8.9–10.3)
CO2: 27 mmol/L (ref 22–32)
CREATININE: 1.11 mg/dL — AB (ref 0.44–1.00)
Chloride: 102 mmol/L (ref 98–111)
GFR, EST AFRICAN AMERICAN: 54 mL/min — AB (ref >60)
GFR, EST NON AFRICAN AMERICAN: 47 mL/min — AB (ref >60)
Glucose, Bld: 102 mg/dL — ABNORMAL HIGH (ref 70–99)
Potassium: 3.4 mmol/L — ABNORMAL LOW (ref 3.5–5.1)
Sodium: 139 mmol/L (ref 135–145)
TOTAL PROTEIN: 7.9 g/dL (ref 6.5–8.1)

## 2017-12-23 NOTE — Pre-Procedure Instructions (Signed)
The patient had an EKG this morning at Dr. Barbarann Ehlers office. I called and requested a copy to be faxed to PAT.

## 2017-12-23 NOTE — Patient Instructions (Signed)
Your procedure is scheduled on: Wednesday 12/29/17.  Report to DAY SURGERY DEPARTMENT LOCATED ON 2ND FLOOR MEDICAL MALL ENTRANCE. To find out your arrival time please call [phone] between 1PM - 3PM on Tuesday 12/28/17.  Remember: Instructions that are not followed completely may result in serious medical risk, up to and including death, or upon the discretion of your surgeon and anesthesiologist your surgery may need to be rescheduled.     _X__ 1. Do not eat food after midnight the night before your procedure.                 No gum chewing or hard candies. You may drink clear liquids up to 2 hours                 before you are scheduled to arrive for your surgery- DO NOT drink clear                 liquids within 2 hours of the start of your surgery.                 Clear Liquids include:  water, apple juice without pulp, clear carbohydrate                 drink such as Clearfast or Gatorade, Black Coffee or Tea (Do not add                 anything to coffee or tea).  __X__2.  On the morning of surgery brush your teeth with toothpaste and water, you may rinse your mouth with mouthwash if you wish.  Do not swallow any toothpaste or mouthwash.     _X__ 3.  No Alcohol for 24 hours before or after surgery.   _X__ 4.  Do Not Smoke or use e-cigarettes For 24 Hours Prior to Your Surgery.                 Do not use any chewable tobacco products for at least 6 hours prior to                 surgery.  ____  5.  Bring all medications with you on the day of surgery if instructed.   __X__  6.  Notify your doctor if there is any change in your medical condition      (cold, fever, infections).     Do not wear jewelry, make-up, hairpins, clips or nail polish. Do not wear lotions, powders, or perfumes.  Do not wear deodorant. Do not shave 48 hours prior to surgery. Men may shave face and neck. Do not bring valuables to the hospital.    [facility] is not responsible for any belongings or  valuables.  Contacts, dentures/partials or body piercings may not be worn into surgery. Bring a case for your contacts, glasses or hearing aids, a denture cup will be supplied. Leave your suitcase in the car. After surgery it may be brought to your room. For patients admitted to the hospital, discharge time is determined by your treatment team.   Patients discharged the day of surgery will not be allowed to drive home.   Please read over the following fact sheets that you were given:   MRSA Information  __X__ Take these medicines the morning of surgery with A SIP OF WATER:     1. citalopram (CELEXA) 20 MG tablet  2. metoprolol succinate (TOPROL-XL) 25 MG 24 hr tablet  3. omeprazole (PRILOSEC) 20 MG capsule  4. acetaminophen (TYLENOL) 325 MG tablet      __X__ Use CHG Soap as directed    __X__ Stop Anti-inflammatories 7 days before surgery such as Advil, Ibuprofen, Motrin, BC or Goodies Powder, Naprosyn, Naproxen, Aleve, Aspirin, Meloxicam. May take Tylenol if needed for pain or discomfort.

## 2017-12-24 ENCOUNTER — Encounter: Payer: Self-pay | Admitting: Oncology

## 2017-12-24 DIAGNOSIS — D0510 Intraductal carcinoma in situ of unspecified breast: Secondary | ICD-10-CM | POA: Insufficient documentation

## 2017-12-24 NOTE — Pre-Procedure Instructions (Signed)
I called Dr. Barbarann Ehlers office requesting EKG result and clearance note optimizing the patient for surgery. Dr. Kary Kos and his nurse will be in the office on Monday and will fax the note that morning.

## 2017-12-24 NOTE — Progress Notes (Signed)
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 Kary Kos  Date of visit: 12/24/17   History of presenting illness-patient is a 77 year old female with a past medical history significant for osteopenia hypertension who recently underwent bilateral screening mammogram on 11/10/2017 which showed some suspicious calcifications in the right breast.  Diagnostic mammogram revealed faint linear calcifications measuring 2.2 x 0.4 x 0.6 cm.  No associated mass or distortion.  This was followed by a core biopsy which revealed high-grade DCIS with comedonecrosis.  Patient has already met with Dr. Peyton Najjar and surgery is planned on 12/29/2017.  Patient previously to birth control Premarin for 10 years her paternal great aunt had history of breast cancer in her 4s.  No other family history of breast ovarian pancreatic or colon cancer.  She has not had any prior abnormal mammograms or breast biopsies.  She otherwise feels well today and denies other complaints.  She attained menopause at the age of 88.  Age of first.  At 55 and first childbirth at the age of 30.  She is G3, P3 L3.  ECOG PS- 1  Pain scale- 0   Review of systems- Review of Systems  Constitutional: Negative for chills, fever, malaise/fatigue and weight loss.  HENT: Negative for congestion, ear discharge and nosebleeds.   Eyes: Negative for blurred vision.  Respiratory: Negative for cough, hemoptysis, sputum production, shortness of breath and wheezing.   Cardiovascular: Negative for chest pain, palpitations, orthopnea and claudication.  Gastrointestinal: Negative for abdominal pain, blood in stool, constipation, diarrhea, heartburn, melena, nausea and vomiting.    Genitourinary: Negative for dysuria, flank pain, frequency, hematuria and urgency.  Musculoskeletal: Negative for back pain, joint pain and myalgias.  Skin: Negative for rash.  Neurological: Negative for dizziness, tingling, focal weakness, seizures, weakness and headaches.  Endo/Heme/Allergies: Does not bruise/bleed easily.  Psychiatric/Behavioral: Negative for depression and suicidal ideas. The patient does not have insomnia.     Allergies  Allergen Reactions  . Codeine Anaphylaxis  . Penicillins Anaphylaxis  . Sulfa Antibiotics Swelling    Patient Active Problem List   Diagnosis Date Noted  . Osteopenia 12/21/2017  . Essential hypertension 04/15/2015  . Heart palpitations 04/15/2015  . SOB (shortness of breath) on exertion 04/15/2015  . Hx of adenomatous colonic polyps 11/17/2013  . H/O cardiac catheterization 11/02/2012     Past Medical History:  Diagnosis Date  . Anxiety   . Cancer ([place])    skin  . Dyspnea    With walking fast or activity around the house  . GERD (gastroesophageal reflux disease)   . History of kidney stones   . Hypertension      Past Surgical History:  Procedure Laterality Date  . ABDOMINAL HYSTERECTOMY    . ACHILLES TENDON REPAIR Right   . BREAST BIOPSY Right 12/09/2017   RIGHT breast Affirm Bx-"coil"  clip (path pending)  . COLONOSCOPY WITH PROPOFOL    . EYE SURGERY     Cataract removal  . SKIN CANCER EXCISION     Basil Cell on the nose    Social History   Socioeconomic History  . Marital status: Married    Spouse name: Not on file  . Number of children: Not on file  . Years of education:  Not on file  . Highest education level: Not on file  Occupational History  . Not on file  Social Needs  . Financial resource strain: Not on file  . Food insecurity:    Worry: Not on file    Inability: Not on file  . Transportation needs:    Medical: Not on file    Non-medical: Not on file  Tobacco Use  . Smoking status: Never Smoker   . Smokeless tobacco: Never Used  Substance and Sexual Activity  . Alcohol use: Never    Frequency: Never  . Drug use: Never  . Sexual activity: Not Currently  Lifestyle  . Physical activity:    Days per week: Not on file    Minutes per session: Not on file  . Stress: Not on file  Relationships  . Social connections:    Talks on phone: Not on file    Gets together: Not on file    Attends religious service: Not on file    Active member of club or organization: Not on file    Attends meetings of clubs or organizations: Not on file    Relationship status: Not on file  . Intimate partner violence:    Fear of current or ex partner: Not on file    Emotionally abused: Not on file    Physically abused: Not on file    Forced sexual activity: Not on file  Other Topics Concern  . Not on file  Social History Narrative  . Not on file     Family History  Problem Relation Age of Onset  . Breast cancer Paternal Aunt 73     Current Outpatient Medications:  .  citalopram (CELEXA) 20 MG tablet, TAKE 1 TABLET BY MOUTH ONCE DAILY, Disp: , Rfl:  .  latanoprost (XALATAN) 0.005 % ophthalmic solution, Place 1 drop into both eyes at bedtime. , Disp: , Rfl: 5 .  metoprolol succinate (TOPROL-XL) 25 MG 24 hr tablet, TAKE ONE TABLET BY MOUTH ONCE DAILY, Disp: , Rfl:  .  triamterene-hydrochlorothiazide (MAXZIDE) 75-50 MG tablet, Take 1 tablet by mouth daily., Disp: , Rfl: 3 .  acetaminophen (TYLENOL) 325 MG tablet, Take 650 mg by mouth every 6 (six) hours as needed., Disp: , Rfl:  .  omeprazole (PRILOSEC) 20 MG capsule, Take 20 mg by mouth daily., Disp: , Rfl:    Physical exam:  Vitals:   12/21/17 1055  BP: 123/78  Pulse: 76  Resp: 18  Temp: 97.8 F (36.6 C)  TempSrc: Tympanic  SpO2: 95%  Weight: 189 lb 14.4 oz (86.1 kg)  Height: _0  (1.626 m)   Physical Exam  Constitutional: She is oriented to person, place, and time. She appears well-developed and well-nourished.  HENT:  Head:  Normocephalic and atraumatic.  Eyes: Pupils are equal, round, and reactive to light. EOM are normal.  Neck: Normal range of motion.  Cardiovascular: Normal rate, regular rhythm and normal heart sounds.  Pulmonary/Chest: Effort normal and breath sounds normal.  Abdominal: Soft. Bowel sounds are normal.  Neurological: She is alert and oriented to person, place, and time.  Skin: Skin is warm and dry.  Breast exam performed in sitting and lying down position.  No evidence of palpable bilateral axillary adenopathy.  There is mild induration and bruising noted at the site of breast biopsy in the lower outer quadrant of the right breast.  No palpable masses in either breast.    CMP Latest Ref Rng & Units 12/23/2017  Glucose  70 - 99 mg/dL 102(H)  BUN 8 - 23 mg/dL 29(H)  Creatinine 0.44 - 1.00 mg/dL 1.11(H)  Sodium 135 - 145 mmol/L 139  Potassium 3.5 - 5.1 mmol/L 3.4(L)  Chloride 98 - 111 mmol/L 102  CO2 22 - 32 mmol/L 27  Calcium 8.9 - 10.3 mg/dL 9.3  Total Protein 6.5 - 8.1 g/dL 7.9  Total Bilirubin 0.3 - 1.2 mg/dL 0.5  Alkaline Phos 38 - 126 U/L 62  AST 15 - 41 U/L 17  ALT 0 - 44 U/L 12   CBC Latest Ref Rng & Units 12/23/2017  WBC 4.0 - 10.5 K/uL 9.1  Hemoglobin 12.0 - 15.0 g/dL 13.4  Hematocrit 36.0 - 46.0 % 41.6  Platelets 150 - 400 K/uL 337    No images are attached to the encounter.  Mm Digital Diagnostic Unilat R  Result Date: 12/01/2017 CLINICAL DATA:  The patient returns after screening study for evaluation of RIGHT breast calcifications. EXAM: DIGITAL DIAGNOSTIC RIGHT MAMMOGRAM WITH CAD COMPARISON:  11/10/2017 and earlier ACR Breast Density Category b: There are scattered areas of fibroglandular density. FINDINGS: Magnified views are performed of calcifications in the LOWER OUTER QUADRANT of the RIGHT breast. On these views there is a group of faint linear calcifications which measures 2.2 x 0.4 x 0.6 centimeters. No associated mass or distortion. Mammographic images were  processed with CAD. IMPRESSION: Suspicious group of calcifications in the RIGHT breast warranting tissue diagnosis. RECOMMENDATION: Stereotactic guided core biopsy of calcifications in the LOWER OUTER QUADRANT of the RIGHT breast. I have discussed the findings and recommendations with the patient. Results were also provided in writing at the conclusion of the visit. If applicable, a reminder letter will be sent to the patient regarding the next appointment. BI-RADS CATEGORY  4: Suspicious. Electronically Signed   By: Nolon Nations M.D.   On: 12/01/2017 10:45   Mm Clip Placement Right  Result Date: 12/09/2017 CLINICAL DATA:  Status post stereotactic guided core biopsy of RIGHT breast calcifications. EXAM: DIAGNOSTIC RIGHT MAMMOGRAM POST STEREOTACTIC BIOPSY COMPARISON:  Previous exam(s). FINDINGS: Mammographic images were obtained following stereotactic guided biopsy of calcifications in the LOWER OUTER QUADRANT of the RIGHT breast. A coil shaped clip is identified adjacent to residual calcifications following biopsy. IMPRESSION: Tissue marker clip in the expected location following biopsy. Final Assessment: Post Procedure Mammograms for Marker Placement Electronically Signed   By: Nolon Nations M.D.   On: 12/09/2017 13:48   Mm Rt Breast Bx W Loc Dev 1st Lesion Image Bx Spec Stereo Guide  Addendum Date: 12/13/2017   ADDENDUM REPORT: 12/13/2017 12:53 ADDENDUM: PATHOLOGY ADDENDUM: Pathology: High-grade comedo type DCIS. Pathology concordant with imaging findings: Yes Recommendation: Surgical consultation. Localization/excision considerations: Wire localization. These results were called by telephone at the time of interpretation on 12/13/2017 at 12:52 pm to the patient, who verbally acknowledged these results. She reports doing well after the biopsy with minimal bruising and tenderness. The findings and recommendations were discussed with the patient. Electronically Signed   By: Fidela Salisbury M.D.    On: 12/13/2017 12:53   Result Date: 12/13/2017 CLINICAL DATA:  Patient presents for stereotactic guided core biopsy of RIGHT breast calcifications. EXAM: RIGHT BREAST STEREOTACTIC CORE NEEDLE BIOPSY COMPARISON:  Previous exams. FINDINGS: The patient and I discussed the procedure of stereotactic-guided biopsy including benefits and alternatives. We discussed the high likelihood of a successful procedure. We discussed the risks of the procedure including infection, bleeding, tissue injury, clip migration, and inadequate sampling. Informed written consent was given.  The usual time out protocol was performed immediately prior to the procedure. Using sterile technique and 1% Lidocaine as local anesthetic, under stereotactic guidance, a 9 gauge vacuum assisted device was used to perform core needle biopsy of calcifications in the LOWER OUTER QUADRANT of the RIGHT breast using a LATERAL to MEDIAL approach. Specimen radiograph was performed showing calcifications in numerous samples. Specimens with calcifications are identified for pathology. Lesion quadrant: LOWER OUTER QUADRANT RIGHT breast At the conclusion of the procedure, a coil shaped tissue marker clip was deployed into the biopsy cavity. Follow-up 2-view mammogram was performed and dictated separately. IMPRESSION: Stereotactic-guided biopsy of RIGHT breast calcifications. No apparent complications. Electronically Signed: By: Nolon Nations M.D. On: 12/09/2017 13:47    Assessment and plan- Patient is a 77 y.o. female with newly diagnosed right breast DCIS high-grade with comedonecrosis.  I have reviewed mammogram and pathology results with the patient.  Patient found to have approximately 2 cm high-grade DCIS in her right breast and is scheduled to undergo lumpectomy on 12/29/2017.  Discussed natural course and etiology of DCIS.  There would be no role for chemotherapy in DCIS.  ER status was not assessed on core biopsy and we are waiting for final  pathology results to tell us that.  If patient found to have ER positive DCIS there would be a role for 5 years of hormone therapy.  I will see her 1 week post surgery to discuss the results of final pathology and have her seen by radiation oncology on the same day.  Discussed briefly side effects of AI if she were to have ER positive DCIS.  Treatment will be given with a curative intent.  Patient had multiple insightful questions and all of them were answered to her satisfaction   Thank you for this kind referral and the opportunity to participate in the care of this patient  Cancer Staging DCIS (ductal carcinoma in situ) of breast Staging form: Breast, AJCC 8th Edition - Clinical stage from 12/21/2017: Stage 0 (cTis (DCIS), cN0, cM0, ER: Unknown, PR: Unknown, HER2: Unknown) - Signed by Sindy Guadeloupe, MD on 12/24/2017   Visit Diagnosis 1. Goals of care, counseling/discussion   2. Ductal carcinoma in situ (DCIS) of right breast     Dr. Randa Evens, MD, MPH [facility] [phone] 12/24/2017 8:32 AM

## 2017-12-28 MED ORDER — CLINDAMYCIN PHOSPHATE 900 MG/50ML IV SOLN
900.0000 mg | INTRAVENOUS | Status: AC
  Administered 2017-12-29: 600 mg via INTRAVENOUS

## 2017-12-29 ENCOUNTER — Other Ambulatory Visit: Payer: Self-pay | Admitting: General Surgery

## 2017-12-29 ENCOUNTER — Ambulatory Visit
Admission: RE | Admit: 2017-12-29 | Discharge: 2017-12-29 | Disposition: A | Discharge status: Home / Self Care | Payer: Medicare HMO | Source: Ambulatory Visit | Attending: General Surgery | Admitting: General Surgery

## 2017-12-29 ENCOUNTER — Encounter: Payer: Self-pay | Admitting: *Deleted

## 2017-12-29 ENCOUNTER — Ambulatory Visit: Payer: Medicare HMO | Admitting: Anesthesiology

## 2017-12-29 ENCOUNTER — Encounter
Admission: RE | Disposition: A | Discharge status: Home / Self Care | Payer: Self-pay | Source: Ambulatory Visit | Attending: General Surgery

## 2017-12-29 ENCOUNTER — Other Ambulatory Visit: Payer: Self-pay

## 2017-12-29 DIAGNOSIS — Z803 Family history of malignant neoplasm of breast: Secondary | ICD-10-CM | POA: Diagnosis not present

## 2017-12-29 DIAGNOSIS — M1990 Unspecified osteoarthritis, unspecified site: Secondary | ICD-10-CM | POA: Insufficient documentation

## 2017-12-29 DIAGNOSIS — D235 Other benign neoplasm of skin of trunk: Secondary | ICD-10-CM | POA: Diagnosis not present

## 2017-12-29 DIAGNOSIS — I10 Essential (primary) hypertension: Secondary | ICD-10-CM | POA: Diagnosis not present

## 2017-12-29 DIAGNOSIS — Z880 Allergy status to penicillin: Secondary | ICD-10-CM | POA: Diagnosis not present

## 2017-12-29 DIAGNOSIS — Z885 Allergy status to narcotic agent status: Secondary | ICD-10-CM | POA: Insufficient documentation

## 2017-12-29 DIAGNOSIS — Z882 Allergy status to sulfonamides status: Secondary | ICD-10-CM | POA: Insufficient documentation

## 2017-12-29 DIAGNOSIS — Z853 Personal history of malignant neoplasm of breast: Secondary | ICD-10-CM | POA: Insufficient documentation

## 2017-12-29 DIAGNOSIS — D493 Neoplasm of unspecified behavior of breast: Secondary | ICD-10-CM | POA: Diagnosis not present

## 2017-12-29 DIAGNOSIS — H409 Unspecified glaucoma: Secondary | ICD-10-CM | POA: Insufficient documentation

## 2017-12-29 DIAGNOSIS — C50911 Malignant neoplasm of unspecified site of right female breast: Secondary | ICD-10-CM | POA: Diagnosis not present

## 2017-12-29 DIAGNOSIS — D0511 Intraductal carcinoma in situ of right breast: Secondary | ICD-10-CM | POA: Insufficient documentation

## 2017-12-29 DIAGNOSIS — H5789 Other specified disorders of eye and adnexa: Secondary | ICD-10-CM | POA: Insufficient documentation

## 2017-12-29 DIAGNOSIS — M8580 Other specified disorders of bone density and structure, unspecified site: Secondary | ICD-10-CM | POA: Insufficient documentation

## 2017-12-29 DIAGNOSIS — D225 Melanocytic nevi of trunk: Secondary | ICD-10-CM | POA: Diagnosis not present

## 2017-12-29 DIAGNOSIS — K219 Gastro-esophageal reflux disease without esophagitis: Secondary | ICD-10-CM | POA: Diagnosis not present

## 2017-12-29 DIAGNOSIS — R921 Mammographic calcification found on diagnostic imaging of breast: Secondary | ICD-10-CM | POA: Diagnosis not present

## 2017-12-29 DIAGNOSIS — Z8249 Family history of ischemic heart disease and other diseases of the circulatory system: Secondary | ICD-10-CM | POA: Insufficient documentation

## 2017-12-29 HISTORY — PX: BREAST EXCISIONAL BIOPSY: SUR124

## 2017-12-29 HISTORY — PX: PARTIAL MASTECTOMY WITH NEEDLE LOCALIZATION: SHX6008

## 2017-12-29 HISTORY — PX: BREAST LUMPECTOMY: SHX2

## 2017-12-29 SURGERY — PARTIAL MASTECTOMY WITH NEEDLE LOCALIZATION
Anesthesia: General | Laterality: Right

## 2017-12-29 MED ORDER — GABAPENTIN 300 MG PO CAPS: 300.0000 mg | ORAL_CAPSULE | Freq: Three times a day (TID) | ORAL | 0 refills | Status: DC

## 2017-12-29 MED ORDER — MIDAZOLAM HCL 2 MG/2ML IJ SOLN
INTRAMUSCULAR | Status: DC | PRN
  Administered 2017-12-29: 1 mg via INTRAVENOUS

## 2017-12-29 MED ORDER — ONDANSETRON HCL 4 MG/2ML IJ SOLN
INTRAMUSCULAR | Status: DC | PRN
  Administered 2017-12-29: 4 mg via INTRAVENOUS

## 2017-12-29 MED ORDER — BUPIVACAINE-EPINEPHRINE (PF) 0.5% -1:200000 IJ SOLN
INTRAMUSCULAR | Status: AC
  Filled 2017-12-29: qty 30

## 2017-12-29 MED ORDER — FENTANYL CITRATE (PF) 100 MCG/2ML IJ SOLN: 25.0000 ug | INTRAMUSCULAR | Status: DC | PRN

## 2017-12-29 MED ORDER — IPRATROPIUM-ALBUTEROL 0.5-2.5 (3) MG/3ML IN SOLN
3.0000 mL | Freq: Once | RESPIRATORY_TRACT | Status: AC
  Administered 2017-12-29: 3 mL via RESPIRATORY_TRACT

## 2017-12-29 MED ORDER — ACETAMINOPHEN 325 MG PO TABS: 325.0000 mg | ORAL_TABLET | ORAL | Status: DC | PRN

## 2017-12-29 MED ORDER — BUPIVACAINE-EPINEPHRINE 0.5% -1:200000 IJ SOLN
INTRAMUSCULAR | Status: DC | PRN
  Administered 2017-12-29: 8 mL

## 2017-12-29 MED ORDER — FENTANYL CITRATE (PF) 100 MCG/2ML IJ SOLN
INTRAMUSCULAR | Status: DC | PRN
  Administered 2017-12-29 (×2): 50 ug via INTRAVENOUS

## 2017-12-29 MED ORDER — PROMETHAZINE HCL 25 MG/ML IJ SOLN: 6.2500 mg | INTRAMUSCULAR | Status: DC | PRN

## 2017-12-29 MED ORDER — PROPOFOL 10 MG/ML IV BOLUS
INTRAVENOUS | Status: AC
  Filled 2017-12-29: qty 20

## 2017-12-29 MED ORDER — PROPOFOL 10 MG/ML IV BOLUS
INTRAVENOUS | Status: DC | PRN
  Administered 2017-12-29: 150 mg via INTRAVENOUS

## 2017-12-29 MED ORDER — DEXAMETHASONE SODIUM PHOSPHATE 10 MG/ML IJ SOLN
INTRAMUSCULAR | Status: DC | PRN
  Administered 2017-12-29: 10 mg via INTRAVENOUS

## 2017-12-29 MED ORDER — LIDOCAINE HCL (CARDIAC) PF 100 MG/5ML IV SOSY
PREFILLED_SYRINGE | INTRAVENOUS | Status: DC | PRN
  Administered 2017-12-29: 80 mg via INTRAVENOUS

## 2017-12-29 MED ORDER — LACTATED RINGERS IV SOLN: INTRAVENOUS | Status: DC

## 2017-12-29 MED ORDER — NABUMETONE 500 MG PO TABS: 500.0000 mg | ORAL_TABLET | Freq: Two times a day (BID) | ORAL | 0 refills | Status: AC

## 2017-12-29 MED ORDER — FENTANYL CITRATE (PF) 100 MCG/2ML IJ SOLN
INTRAMUSCULAR | Status: AC
  Filled 2017-12-29: qty 4

## 2017-12-29 MED ORDER — MIDAZOLAM HCL 2 MG/2ML IJ SOLN
INTRAMUSCULAR | Status: AC
  Filled 2017-12-29: qty 2

## 2017-12-29 MED ORDER — PHENYLEPHRINE HCL 10 MG/ML IJ SOLN
INTRAMUSCULAR | Status: DC | PRN
  Administered 2017-12-29: 200 ug via INTRAVENOUS

## 2017-12-29 MED ORDER — IPRATROPIUM-ALBUTEROL 0.5-2.5 (3) MG/3ML IN SOLN
RESPIRATORY_TRACT | Status: AC
  Filled 2017-12-29: qty 3

## 2017-12-29 MED ORDER — MEPERIDINE HCL 50 MG/ML IJ SOLN: 6.2500 mg | INTRAMUSCULAR | Status: DC | PRN

## 2017-12-29 MED ORDER — CLINDAMYCIN PHOSPHATE 900 MG/50ML IV SOLN
INTRAVENOUS | Status: AC
  Filled 2017-12-29: qty 50

## 2017-12-29 MED ORDER — ACETAMINOPHEN 160 MG/5ML PO SOLN
325.0000 mg | ORAL | Status: DC | PRN
  Filled 2017-12-29: qty 20.3

## 2017-12-29 SURGICAL SUPPLY — 34 items
CANISTER SUCT 1200ML W/VALVE (MISCELLANEOUS) ×3 IMPLANT
CHLORAPREP W/TINT 26ML (MISCELLANEOUS) ×3 IMPLANT
CNTNR SPEC 2.5X3XGRAD LEK (MISCELLANEOUS) ×1
CONT SPEC 4OZ STER OR WHT (MISCELLANEOUS) ×2
CONTAINER SPEC 2.5X3XGRAD LEK (MISCELLANEOUS) ×1 IMPLANT
COVER WAND RF STERILE (DRAPES) IMPLANT
DERMABOND ADVANCED (GAUZE/BANDAGES/DRESSINGS) ×2
DERMABOND ADVANCED .7 DNX12 (GAUZE/BANDAGES/DRESSINGS) ×1 IMPLANT
DEVICE DUBIN SPECIMEN MAMMOGRA (MISCELLANEOUS) ×3 IMPLANT
DRAPE LAPAROTOMY 77X122 PED (DRAPES) ×3 IMPLANT
ELECT REM PT RETURN 9FT ADLT (ELECTROSURGICAL) ×3
ELECTRODE REM PT RTRN 9FT ADLT (ELECTROSURGICAL) ×1 IMPLANT
GLOVE BIO SURGEON STRL SZ 6.5 (GLOVE) ×10 IMPLANT
GLOVE BIO SURGEONS STRL SZ 6.5 (GLOVE) ×5
GOWN STRL REUS W/ TWL LRG LVL3 (GOWN DISPOSABLE) ×3 IMPLANT
GOWN STRL REUS W/TWL LRG LVL3 (GOWN DISPOSABLE) ×6
KIT TURNOVER KIT A (KITS) ×3 IMPLANT
LABEL OR SOLS (LABEL) ×3 IMPLANT
MARGIN MAP 10MM (MISCELLANEOUS) ×3 IMPLANT
NEEDLE HYPO 25X1 1.5 SAFETY (NEEDLE) ×3 IMPLANT
PACK BASIN MINOR ARMC (MISCELLANEOUS) ×3 IMPLANT
RETRACTOR WOUND ALXS 18CM SML (MISCELLANEOUS) ×1 IMPLANT
RTRCTR WOUND ALEXIS O 18CM SML (MISCELLANEOUS) ×3
SLEVE PROBE SENORX GAMMA FIND (MISCELLANEOUS) ×3 IMPLANT
SUT ETHILON 3-0 FS-10 30 BLK (SUTURE) ×3
SUT MNCRL 4-0 (SUTURE) ×2
SUT MNCRL 4-0 27XMFL (SUTURE) ×1
SUT SILK 2 0 SH (SUTURE) ×3 IMPLANT
SUT VIC AB 3-0 SH 27 (SUTURE) ×2
SUT VIC AB 3-0 SH 27X BRD (SUTURE) ×1 IMPLANT
SUTURE EHLN 3-0 FS-10 30 BLK (SUTURE) ×1 IMPLANT
SUTURE MNCRL 4-0 27XMF (SUTURE) ×1 IMPLANT
SYR 10ML LL (SYRINGE) ×3 IMPLANT
WATER STERILE IRR 1000ML POUR (IV SOLUTION) ×3 IMPLANT

## 2017-12-29 NOTE — Op Note (Addendum)
Preoperative diagnosis: Right breast carcinoma (DCIS).                                             Right breast skin lesion  Postoperative diagnosis: Right breast carcinoma (DCIS)                                               Right breast skin lesion  Procedure: Right needle-localized breast partial mastectomy.                       Right breast excision of skin lesion.   Anesthesia: GETA  Surgeon: Dr. Windell Moment  Wound Classification: Clean  Indications: Patient is a 77 y.o. female with a nonpalpable right breast mass noted on mammography with core biopsy demonstrating DCIS requires needle-localized lumpectomy for treatment. Patient also had a skin lesion on same breast that has been bothering and sometimes bleeding. Excision was recommended.    Findings: 1. Specimen mammography shows marker, wire and calcification on specimen 2. Pathology call refers gross examination of margins are grossly clear 3. No other palpable mass or lymph node identified.  4. External skin lesion identified (1.5 cm in vivo longest diameter). 5. Adequate hemostasis.   Description of procedure: Preoperative needle localization was performed by radiology. Localization studies were reviewed. The patient was taken to the operating room and placed supine on the operating table, and after general anesthesia the right chest and axilla were prepped and draped in the usual sterile fashion. A time-out was completed verifying correct patient, procedure, site, positioning, and implant(s) and/or special equipment prior to beginning this procedure.  By comparing the localization studies with the direction and skin entry site of the needle, the probable trajectory and location of the mass was visualized. A circumareolar skin incision was planned in such a way as to minimize the amount of dissection to reach the mass.  The skin incision was made. Flaps were raised and the location of the wire confirmed. The wire was delivered  into the wound. A 2-0 silk figure-of-eight stay suture was placed around the wire and used for retraction. Dissection was then taken down circumferentially, taking care to include the entire localizing needle and a wide margin of grossly normal tissue. The specimen and entire localizing wire were removed. The specimen was oriented and sent to radiology with the localization studies. Confirmation was received that the entire target lesion had been resected. I reviewed the images and the calcifications extended close to the medial margin. This margin was re excised to increase the chances of having negative margins. The wound was irrigated. Hemostasis was checked. The wound was closed with interrupted sutures of 4-0 Vicryl and a subcuticular suture of Monocryl 4-0. No attempt was made to close the dead space.  On the same breast, the skin lesion was resected. With #15 blade, an elliptical incision was done around the palpable skin lesion. Dissection was carried down to the dermal tissue and lesion removed completely. Hemostasis achieved and wound closed with 4-0 Monocryl.  Dermabond was applied to both wounds.   Specimen: Right Breast mass (Orientation markers used: Cranial, Medial, Deep)        Medial margin  Right breast skin lesin  Complications: None  Estimated Blood Loss: 5 mL

## 2017-12-29 NOTE — Anesthesia Post-op Follow-up Note (Signed)
Anesthesia QCDR form completed.        

## 2017-12-29 NOTE — OR Nursing (Signed)
Discharge instructions discussed with pt and husband. Both voice understanding. 

## 2017-12-29 NOTE — Discharge Instructions (Signed)
°  Diet: Resume home heart healthy regular diet.   Activity: Increase activity as tolerated, light activity and walking are encouraged. Do not drive or drink alcohol if taking narcotic pain medications.  Wound care: May shower with soapy water and pat dry (do not rub incisions), but no baths or submerging incision underwater until follow-up. (no swimming)   Medications: Resume all home medications. For mild to moderate pain: acetaminophen (Tylenol) or ibuprofen (if no kidney disease). Combining Tylenol with alcohol can substantially increase your risk of causing liver disease.  Call office [phone]) at any time if any questions, worsening pain, fevers/chills, bleeding, drainage from incision site, or other concerns.   AMBULATORY SURGERY  DISCHARGE INSTRUCTIONS   1) The drugs that you were given will stay in your system until tomorrow so for the next 24 hours you should not:  A) Drive an automobile B) Make any legal decisions C) Drink any alcoholic beverage   2) You may resume regular meals tomorrow.  Today it is better to start with liquids and gradually work up to solid foods.  You may eat anything you prefer, but it is better to start with liquids, then soup and crackers, and gradually work up to solid foods.   3) Please notify your doctor immediately if you have any unusual bleeding, trouble breathing, redness and pain at the surgery site, drainage, fever, or pain not relieved by medication.    4) Additional Instructions:        Please contact your physician with any problems or Same Day Surgery at [phone], Monday through Friday 6 am to 4 pm, or [place] at [facility] number at [phone].

## 2017-12-29 NOTE — Transfer of Care (Signed)
Immediate Anesthesia Transfer of Care Note  Patient: Susan Montgomery  Procedure(s) Performed: PARTIAL MASTECTOMY WITH NEEDLE LOCALIZATION (Right )  Patient Location: PACU  Anesthesia Type:General  Level of Consciousness: sedated  Airway & Oxygen Therapy: Patient Spontanous Breathing and Patient connected to face mask oxygen  Post-op Assessment: Report given to RN and Post -op Vital signs reviewed and stable  Post vital signs: Reviewed and stable  Last Vitals:  Vitals Value Taken Time  BP 131/67 12/29/2017 11:21 AM  Temp    Pulse 73 12/29/2017 11:24 AM  Resp 20 12/29/2017 11:24 AM  SpO2 99 % 12/29/2017 11:24 AM  Vitals shown include unvalidated device data.  Last Pain:  Vitals:   12/29/17 0856  TempSrc: Temporal  PainSc: 0-No pain         Complications: No apparent anesthesia complications

## 2017-12-29 NOTE — Anesthesia Procedure Notes (Signed)
Procedure Name: LMA Insertion Date/Time: 12/29/2017 9:53 AM Performed by: Philbert Riser, CRNA Pre-anesthesia Checklist: Patient identified, Emergency Drugs available, Suction available, Patient being monitored and Timeout performed Patient Re-evaluated:Patient Re-evaluated prior to induction Oxygen Delivery Method: Circle system utilized and Simple face mask Preoxygenation: Pre-oxygenation with 100% oxygen Induction Type: IV induction Ventilation: Mask ventilation without difficulty LMA Size: 4.0 Number of attempts: 1 Placement Confirmation: positive ETCO2 Dental Injury: Teeth and Oropharynx as per pre-operative assessment

## 2017-12-29 NOTE — Anesthesia Preprocedure Evaluation (Signed)
Anesthesia Evaluation  Patient identified by MRN, date of birth, ID band Patient awake    Reviewed: Allergy & Precautions, H&P , NPO status , reviewed documented beta blocker date and time   Airway Mallampati: II  TM Distance: >3 FB Neck ROM: full    Dental  (+) Chipped   Pulmonary shortness of breath,    Pulmonary exam normal        Cardiovascular hypertension, Normal cardiovascular exam     Neuro/Psych PSYCHIATRIC DISORDERS Anxiety    GI/Hepatic GERD  Medicated and Controlled,  Endo/Other    Renal/GU      Musculoskeletal   Abdominal   Peds  Hematology   Anesthesia Other Findings Past Medical History: No date: Anxiety No date: Cancer [facility])     Comment:  skin No date: Dyspnea     Comment:  With walking fast or activity around the house No date: GERD (gastroesophageal reflux disease) No date: History of kidney stones No date: Hypertension Past Surgical History: No date: ABDOMINAL HYSTERECTOMY No date: ACHILLES TENDON REPAIR; Right 12/09/2017: BREAST BIOPSY; Right     Comment:  RIGHT breast Affirm Bx-"coil"  clip (path pending) 12/29/2017: BREAST LUMPECTOMY; Right No date: COLONOSCOPY WITH PROPOFOL No date: EYE SURGERY     Comment:  Cataract removal No date: SKIN CANCER EXCISION     Comment:  Basil Cell on the nose BMI    Body Mass Index:  32.42 kg/m     Reproductive/Obstetrics                             Anesthesia Physical Anesthesia Plan  ASA: II  Anesthesia Plan: General LMA   Post-op Pain Management:    Induction: Intravenous  PONV Risk Score and Plan: 3 and Ondansetron, Treatment may vary due to age or medical condition, Metaclopromide and Midazolam  Airway Management Planned: LMA  Additional Equipment:   Intra-op Plan:   Post-operative Plan: Extubation in OR  Informed Consent: I have reviewed the patients History and Physical, chart, labs and discussed the  procedure including the risks, benefits and alternatives for the proposed anesthesia with the patient or authorized representative who has indicated his/her understanding and acceptance.   Dental Advisory Given  Plan Discussed with: CRNA  Anesthesia Plan Comments:         Anesthesia Quick Evaluation

## 2017-12-29 NOTE — Interval H&P Note (Signed)
History and Physical Interval Note:  12/29/2017 9:12 AM  Susan Montgomery  has presented today for surgery, with the diagnosis of NEOPLASM OF RIGHT BREAST  The various methods of treatment have been discussed with the patient and family. After consideration of risks, benefits and other options for treatment, the patient has consented to  Procedure(s): PARTIAL MASTECTOMY WITH NEEDLE LOCALIZATION (Right) as a surgical intervention .  The patient's history has been reviewed, patient examined, no change in status, stable for surgery.  I have reviewed the patient's chart and labs.  Right breast marked in th e pre procedure room. Questions were answered to the patient's satisfaction.     Herbert Pun

## 2017-12-30 ENCOUNTER — Encounter: Payer: Self-pay | Admitting: General Surgery

## 2018-01-03 LAB — SURGICAL PATHOLOGY

## 2018-01-06 ENCOUNTER — Other Ambulatory Visit: Payer: Self-pay

## 2018-01-06 ENCOUNTER — Encounter: Payer: Self-pay | Admitting: Oncology

## 2018-01-06 ENCOUNTER — Encounter: Payer: Self-pay | Admitting: Radiation Oncology

## 2018-01-06 ENCOUNTER — Inpatient Hospital Stay (HOSPITAL_BASED_OUTPATIENT_CLINIC_OR_DEPARTMENT_OTHER): Payer: Medicare HMO | Admitting: Oncology

## 2018-01-06 ENCOUNTER — Ambulatory Visit
Admission: RE | Admit: 2018-01-06 | Discharge: 2018-01-06 | Disposition: A | Discharge status: Home / Self Care | Payer: Medicare HMO | Source: Ambulatory Visit | Attending: Radiation Oncology | Admitting: Radiation Oncology

## 2018-01-06 VITALS — BP 116/74 | HR 69 | Temp 97.9°F | Resp 18 | Ht 64.0 in | Wt 189.7 lb

## 2018-01-06 DIAGNOSIS — Z923 Personal history of irradiation: Secondary | ICD-10-CM | POA: Diagnosis not present

## 2018-01-06 DIAGNOSIS — Z803 Family history of malignant neoplasm of breast: Secondary | ICD-10-CM | POA: Diagnosis not present

## 2018-01-06 DIAGNOSIS — M8580 Other specified disorders of bone density and structure, unspecified site: Secondary | ICD-10-CM | POA: Diagnosis not present

## 2018-01-06 DIAGNOSIS — K219 Gastro-esophageal reflux disease without esophagitis: Secondary | ICD-10-CM | POA: Insufficient documentation

## 2018-01-06 DIAGNOSIS — Z170 Estrogen receptor positive status [ER+]: Secondary | ICD-10-CM | POA: Diagnosis not present

## 2018-01-06 DIAGNOSIS — D0511 Intraductal carcinoma in situ of right breast: Secondary | ICD-10-CM

## 2018-01-06 DIAGNOSIS — F419 Anxiety disorder, unspecified: Secondary | ICD-10-CM | POA: Diagnosis not present

## 2018-01-06 DIAGNOSIS — Z87442 Personal history of urinary calculi: Secondary | ICD-10-CM | POA: Diagnosis not present

## 2018-01-06 DIAGNOSIS — R0602 Shortness of breath: Secondary | ICD-10-CM | POA: Insufficient documentation

## 2018-01-06 DIAGNOSIS — Z7189 Other specified counseling: Secondary | ICD-10-CM

## 2018-01-06 DIAGNOSIS — I10 Essential (primary) hypertension: Secondary | ICD-10-CM | POA: Insufficient documentation

## 2018-01-06 NOTE — Anesthesia Postprocedure Evaluation (Signed)
Anesthesia Post Note  Patient: Susan Montgomery  Procedure(s) Performed: PARTIAL MASTECTOMY WITH NEEDLE LOCALIZATION (Right )  Patient location during evaluation: PACU Anesthesia Type: General Level of consciousness: awake and alert Pain management: pain level controlled Vital Signs Assessment: post-procedure vital signs reviewed and stable Respiratory status: spontaneous breathing, nonlabored ventilation, respiratory function stable and patient connected to nasal cannula oxygen Cardiovascular status: blood pressure returned to baseline and stable Postop Assessment: no apparent nausea or vomiting Anesthetic complications: no     Last Vitals:  Vitals:   12/29/17 1217 12/29/17 1226  BP: 125/68 117/71  Pulse: 68   Resp: 16   Temp: (!) 36.2 C   SpO2: 94% 95%    Last Pain:  Vitals:   12/29/17 1226  TempSrc:   PainSc: 2                  Alphonsus Sias

## 2018-01-06 NOTE — Progress Notes (Signed)
No new changes noted today 

## 2018-01-06 NOTE — Consult Note (Signed)
NEW PATIENT EVALUATION  Name: Susan Montgomery  MRN: [id number]  Date:   01/06/2018     DOB: [birth year]   This 77 y.o. 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  REFERRING PHYSICIAN: Maryland Pink, MD  CHIEF COMPLAINT:  Chief Complaint  Patient presents with  . Breast Cancer    Initial eval    DIAGNOSIS: The encounter diagnosis was Ductal carcinoma in situ (DCIS) of right breast.   PREVIOUS INVESTIGATIONS:  Mammogram and ultrasound reviewed Pathology reports reviewed Clinical notes reviewed  HPI: patient is a 77 year old female who presented with an abnormal mammogram of her right breast showing suspicious calcifications faint linearmeasuring 2.2 cm in greatest dimension. She underwent core biopsy showed high-grade ductal carcinoma in situ with comedonecrosis.She then underwent a wide local excision showing at least 2 cm of high-grade ductal carcinoma in situ's grade 3 with comedonecrosis. Margins were clear but anterior margin was close at 0.5 mm. Tumor was ER/PR positive. Patient has done well postoperatively although still somewhat sore.she is now referred to radiation oncology for consideration of treatment.  PLANNED TREATMENT REGIMEN: hypofractionated breast radiation with scar boost  PAST MEDICAL HISTORY:  has a past medical history of Anxiety, Cancer ([place]), Dyspnea, GERD (gastroesophageal reflux disease), History of kidney stones, and Hypertension.    PAST SURGICAL HISTORY:  Past Surgical History:  Procedure Laterality Date  . ABDOMINAL HYSTERECTOMY    . ACHILLES TENDON REPAIR Right   . BREAST BIOPSY Right 12/09/2017   -"coil"  clip High-grade comedo type DCIS  . BREAST EXCISIONAL BIOPSY Right 12/29/2017   lumpectomy  . BREAST LUMPECTOMY Right 12/29/2017  . COLONOSCOPY WITH PROPOFOL    . EYE SURGERY     Cataract removal  .  PARTIAL MASTECTOMY WITH NEEDLE LOCALIZATION Right 12/29/2017   Procedure: PARTIAL MASTECTOMY WITH NEEDLE LOCALIZATION;  Surgeon: Herbert Pun, MD;  Location: [facility] ORS;  Service: General;  Laterality: Right;  . SKIN CANCER EXCISION     Basil Cell on the nose    FAMILY HISTORY: family history includes Breast cancer (age of onset: 42) in her paternal aunt.  SOCIAL HISTORY:  reports that she has never smoked. She has never used smokeless tobacco. She reports that she does not drink alcohol or use drugs.  ALLERGIES: Codeine; Penicillins; and Sulfa antibiotics  MEDICATIONS:  Current Outpatient Medications  Medication Sig Dispense Refill  . acetaminophen (TYLENOL) 325 MG tablet Take 650 mg by mouth every 6 (six) hours as needed.    . citalopram (CELEXA) 20 MG tablet TAKE 1 TABLET BY MOUTH ONCE DAILY    . gabapentin (NEURONTIN) 300 MG capsule Take 1 capsule (300 mg total) by mouth 3 (three) times daily for 14 days. 42 capsule 0  . latanoprost (XALATAN) 0.005 % ophthalmic solution Place 1 drop into both eyes at bedtime.   5  . metoprolol succinate (TOPROL-XL) 25 MG 24 hr tablet TAKE ONE TABLET BY MOUTH ONCE DAILY    . nabumetone (RELAFEN) 500 MG tablet Take 1 tablet (500 mg total) by mouth 2 (two) times daily for 14 days. 28 tablet 0  . omeprazole (PRILOSEC) 20 MG capsule Take 20 mg by mouth daily.    Marland Kitchen triamterene-hydrochlorothiazide (MAXZIDE) 75-50 MG tablet Take 1 tablet by mouth daily.  3   No current facility-administered medications for this encounter.     ECOG PERFORMANCE STATUS:  0 -  Asymptomatic  REVIEW OF SYSTEMS:  Patient denies any weight loss, fatigue, weakness, fever, chills or night sweats. Patient denies any loss of vision, blurred vision. Patient denies any ringing  of the ears or hearing loss. No irregular heartbeat. Patient denies heart murmur or history of fainting. Patient denies any chest pain or pain radiating to her upper extremities. Patient denies any shortness  of breath, difficulty breathing at night, cough or hemoptysis. Patient denies any swelling in the lower legs. Patient denies any nausea vomiting, vomiting of blood, or coffee ground material in the vomitus. Patient denies any stomach pain. Patient states has had normal bowel movements no significant constipation or diarrhea. Patient denies any dysuria, hematuria or significant nocturia. Patient denies any problems walking, swelling in the joints or loss of balance. Patient denies any skin changes, loss of hair or loss of weight. Patient denies any excessive worrying or anxiety or significant depression. Patient denies any problems with insomnia. Patient denies excessive thirst, polyuria, polydipsia. Patient denies any swollen glands, patient denies easy bruising or easy bleeding. Patient denies any recent infections, allergies or URI. Patient "s visual fields have not changed significantly in recent time.    PHYSICAL EXAM: BP (P) 120/70 (BP Location: Left Arm, Patient Position: Sitting)   Pulse (P) 78   Temp (!) (P) 95.9 F (35.5 C) (Tympanic)   Wt (P) 189 lb 7.8 oz (86 kg)   BMI (P) 32.53 kg/m  Right breast is wide local excision scar which is healing well does also motor slight incision in the 4:00 positionrelated to a skin cancer that was removed. No dominant mass or nodularity is noted in either breast in 2 positions examined. No axillary or supraclavicular adenopathy is identified.Well-developed well-nourished patient in NAD. HEENT reveals PERLA, EOMI, discs not visualized.  Oral cavity is clear. No oral mucosal lesions are identified. Neck is clear without evidence of cervical or supraclavicular adenopathy. Lungs are clear to A&P. Cardiac examination is essentially unremarkable with regular rate and rhythm without murmur rub or thrill. Abdomen is benign with no organomegaly or masses noted. Motor sensory and DTR levels are equal and symmetric in the upper and lower extremities. Cranial nerves II  through XII are grossly intact. Proprioception is intact. No peripheral adenopathy or edema is identified. No motor or sensory levels are noted. Crude visual fields are within normal range.  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  PLAN: at this time based on the patient's age ER/PR positive status even though we have not achieved 2 mm margin I believe she would be fine with whole breast hypofractionated course of treatment. I would also boost her scar another 2000 cGy to ensure local regional control. Risks and benefits of treatment including skin reaction fatigue alteration of blood counts possible inclusion of superficial lung all were discussed in detail with the patient. Patient also will be candidate for antiestrogen therapy after completion of radiation. Patient comprehends my treatment plan well. I've personally set up and ordered CT simulation in about a week's time to allow some more healing.  I would like to take this opportunity to thank you for allowing me to participate in the care of your patient.Noreene Filbert, MD

## 2018-01-07 NOTE — Progress Notes (Signed)
Hematology/Oncology Consult note [facility]  [phone] [phone]  Patient Care Team: Maryland Pink, MD as PCP - General (Family Medicine)   Name of the patient: Susan Montgomery  [id number]  1940-04-16   Date of visit: 01/07/18  Diagnosis-right breast DCIS ER positive  Chief complaint/ Reason for visit-discuss results of pathology and further management  Heme/Onc history: patient is a 77 year old female with a past medical history significant for osteopenia hypertension who recently underwent bilateral screening mammogram on 11/10/2017 which showed some suspicious calcifications in the right breast.  Diagnostic mammogram revealed faint linear calcifications measuring 2.2 x 0.4 x 0.6 cm.  No associated mass or distortion.  This was followed by a core biopsy which revealed high-grade DCIS with comedonecrosis.  Patient has already met with Dr. Peyton Najjar and surgery is planned on 12/29/2017.  Patient previously to birth control Premarin for 10 years her paternal great aunt had history of breast cancer in her 29s.  No other family history of breast ovarian pancreatic or colon cancer.  She has not had any prior abnormal mammograms or breast biopsies.  She otherwise feels well today and denies other complaints.  She attained menopause at the age of 13.  Age of first.  At 41 and first childbirth at the age of 15.  She is G3, P3 L3.  Final pathology showed high-grade DCIS with calcifications.Distance from closest anterior margin was less than 0.5 mm focally.  Extent of DCIS was at least 20 mm, grade 3  Interval history-she is doing well post surgery and has some mild soreness at the site of surgery.  Denies other complaints  ECOG PS- 1 Pain scale- 0 Opioid associated constipation- no  Review of systems- Review of Systems  Constitutional: Negative for chills, fever, malaise/fatigue and weight loss.  HENT: Negative for congestion, ear discharge and  nosebleeds.   Eyes: Negative for blurred vision.  Respiratory: Negative for cough, hemoptysis, sputum production, shortness of breath and wheezing.   Cardiovascular: Negative for chest pain, palpitations, orthopnea and claudication.  Gastrointestinal: Negative for abdominal pain, blood in stool, constipation, diarrhea, heartburn, melena, nausea and vomiting.  Genitourinary: Negative for dysuria, flank pain, frequency, hematuria and urgency.  Musculoskeletal: Negative for back pain, joint pain and myalgias.  Skin: Negative for rash.  Neurological: Negative for dizziness, tingling, focal weakness, seizures, weakness and headaches.  Endo/Heme/Allergies: Does not bruise/bleed easily.  Psychiatric/Behavioral: Negative for depression and suicidal ideas. The patient does not have insomnia.        Allergies  Allergen Reactions  . Codeine Anaphylaxis  . Penicillins Anaphylaxis  . Sulfa Antibiotics Swelling     Past Medical History:  Diagnosis Date  . Anxiety   . Cancer ([place])    skin  . Dyspnea    With walking fast or activity around the house  . GERD (gastroesophageal reflux disease)   . History of kidney stones   . Hypertension      Past Surgical History:  Procedure Laterality Date  . ABDOMINAL HYSTERECTOMY    . ACHILLES TENDON REPAIR Right   . BREAST BIOPSY Right 12/09/2017   -"coil"  clip High-grade comedo type DCIS  . BREAST EXCISIONAL BIOPSY Right 12/29/2017   lumpectomy  . BREAST LUMPECTOMY Right 12/29/2017  . COLONOSCOPY WITH PROPOFOL    . EYE SURGERY     Cataract removal  . PARTIAL MASTECTOMY WITH NEEDLE LOCALIZATION Right 12/29/2017   Procedure: PARTIAL MASTECTOMY WITH NEEDLE LOCALIZATION;  Surgeon: Herbert Pun, MD;  Location:  [facility] ORS;  Service: General;  Laterality: Right;  . SKIN CANCER EXCISION     Basil Cell on the nose    Social History   Socioeconomic History  . Marital status: Married    Spouse name: Not on file  . Number of children: Not  on file  . Years of education: Not on file  . Highest education level: Not on file  Occupational History  . Not on file  Social Needs  . Financial resource strain: Not on file  . Food insecurity:    Worry: Not on file    Inability: Not on file  . Transportation needs:    Medical: Not on file    Non-medical: Not on file  Tobacco Use  . Smoking status: Never Smoker  . Smokeless tobacco: Never Used  Substance and Sexual Activity  . Alcohol use: Never    Frequency: Never  . Drug use: Never  . Sexual activity: Not Currently  Lifestyle  . Physical activity:    Days per week: Not on file    Minutes per session: Not on file  . Stress: Not on file  Relationships  . Social connections:    Talks on phone: Not on file    Gets together: Not on file    Attends religious service: Not on file    Active member of club or organization: Not on file    Attends meetings of clubs or organizations: Not on file    Relationship status: Not on file  . Intimate partner violence:    Fear of current or ex partner: Not on file    Emotionally abused: Not on file    Physically abused: Not on file    Forced sexual activity: Not on file  Other Topics Concern  . Not on file  Social History Narrative  . Not on file    Family History  Problem Relation Age of Onset  . Breast cancer Paternal Aunt 67     Current Outpatient Medications:  .  citalopram (CELEXA) 20 MG tablet, TAKE 1 TABLET BY MOUTH ONCE DAILY, Disp: , Rfl:  .  gabapentin (NEURONTIN) 300 MG capsule, Take 1 capsule (300 mg total) by mouth 3 (three) times daily for 14 days., Disp: 42 capsule, Rfl: 0 .  latanoprost (XALATAN) 0.005 % ophthalmic solution, Place 1 drop into both eyes at bedtime. , Disp: , Rfl: 5 .  metoprolol succinate (TOPROL-XL) 25 MG 24 hr tablet, TAKE ONE TABLET BY MOUTH ONCE DAILY, Disp: , Rfl:  .  nabumetone (RELAFEN) 500 MG tablet, Take 1 tablet (500 mg total) by mouth 2 (two) times daily for 14 days., Disp: 28 tablet,  Rfl: 0 .  triamterene-hydrochlorothiazide (MAXZIDE) 75-50 MG tablet, Take 1 tablet by mouth daily., Disp: , Rfl: 3 .  acetaminophen (TYLENOL) 325 MG tablet, Take 650 mg by mouth every 6 (six) hours as needed., Disp: , Rfl:  .  omeprazole (PRILOSEC) 20 MG capsule, Take 20 mg by mouth daily., Disp: , Rfl:   Physical exam:  Vitals:   01/06/18 1127  BP: 116/74  Pulse: 69  Resp: 18  Temp: 97.9 F (36.6 C)  TempSrc: Tympanic  SpO2: 96%  Weight: 189 lb 11.2 oz (86 kg)  Height: 5' 4" (1.626 m)   Physical Exam  Constitutional: She is oriented to person, place, and time. She appears well-developed and well-nourished.  HENT:  Head: Normocephalic and atraumatic.  Eyes: Pupils are equal, round, and reactive to light. EOM are normal.  Neck: Normal range of motion.  Cardiovascular: Normal rate, regular rhythm and normal heart sounds.  Pulmonary/Chest: Effort normal and breath sounds normal.  Abdominal: Soft. Bowel sounds are normal.  Neurological: She is alert and oriented to person, place, and time.  Skin: Skin is warm and dry.  Patient is status post right lumpectomy with a well-healed surgical scar  CMP Latest Ref Rng & Units 12/23/2017  Glucose 70 - 99 mg/dL 102(H)  BUN 8 - 23 mg/dL 29(H)  Creatinine 0.44 - 1.00 mg/dL 1.11(H)  Sodium 135 - 145 mmol/L 139  Potassium 3.5 - 5.1 mmol/L 3.4(L)  Chloride 98 - 111 mmol/L 102  CO2 22 - 32 mmol/L 27  Calcium 8.9 - 10.3 mg/dL 9.3  Total Protein 6.5 - 8.1 g/dL 7.9  Total Bilirubin 0.3 - 1.2 mg/dL 0.5  Alkaline Phos 38 - 126 U/L 62  AST 15 - 41 U/L 17  ALT 0 - 44 U/L 12   CBC Latest Ref Rng & Units 12/23/2017  WBC 4.0 - 10.5 K/uL 9.1  Hemoglobin 12.0 - 15.0 g/dL 13.4  Hematocrit 36.0 - 46.0 % 41.6  Platelets 150 - 400 K/uL 337    No images are attached to the encounter.  Mm Breast Surgical Specimen  Result Date: 12/29/2017 CLINICAL DATA:  Status post right breast lumpectomy. EXAM: SPECIMEN RADIOGRAPH OF THE RIGHT BREAST COMPARISON:   Previous exam(s). FINDINGS: Status post excision of the right breast. The wire tip, calcifications and biopsy marker clip are present and are marked for pathology. IMPRESSION: Specimen radiograph of the right breast. Electronically Signed   By: Abelardo Diesel M.D.   On: 12/29/2017 10:37   Mm Clip Placement Right  Result Date: 12/09/2017 CLINICAL DATA:  Status post stereotactic guided core biopsy of RIGHT breast calcifications. EXAM: DIAGNOSTIC RIGHT MAMMOGRAM POST STEREOTACTIC BIOPSY COMPARISON:  Previous exam(s). FINDINGS: Mammographic images were obtained following stereotactic guided biopsy of calcifications in the LOWER OUTER QUADRANT of the RIGHT breast. A coil shaped clip is identified adjacent to residual calcifications following biopsy. IMPRESSION: Tissue marker clip in the expected location following biopsy. Final Assessment: Post Procedure Mammograms for Marker Placement Electronically Signed   By: Nolon Nations M.D.   On: 12/09/2017 13:48   Mm Rt Breast Bx W Loc Dev 1st Lesion Image Bx Spec Stereo Guide  Addendum Date: 12/13/2017   ADDENDUM REPORT: 12/13/2017 12:53 ADDENDUM: PATHOLOGY ADDENDUM: Pathology: High-grade comedo type DCIS. Pathology concordant with imaging findings: Yes Recommendation: Surgical consultation. Localization/excision considerations: Wire localization. These results were called by telephone at the time of interpretation on 12/13/2017 at 12:52 pm to the patient, who verbally acknowledged these results. She reports doing well after the biopsy with minimal bruising and tenderness. The findings and recommendations were discussed with the patient. Electronically Signed   By: Fidela Salisbury M.D.   On: 12/13/2017 12:53   Result Date: 12/13/2017 CLINICAL DATA:  Patient presents for stereotactic guided core biopsy of RIGHT breast calcifications. EXAM: RIGHT BREAST STEREOTACTIC CORE NEEDLE BIOPSY COMPARISON:  Previous exams. FINDINGS: The patient and I discussed the procedure  of stereotactic-guided biopsy including benefits and alternatives. We discussed the high likelihood of a successful procedure. We discussed the risks of the procedure including infection, bleeding, tissue injury, clip migration, and inadequate sampling. Informed written consent was given. The usual time out protocol was performed immediately prior to the procedure. Using sterile technique and 1% Lidocaine as local anesthetic, under stereotactic guidance, a 9 gauge vacuum assisted device was used to perform core needle  biopsy of calcifications in the LOWER OUTER QUADRANT of the RIGHT breast using a LATERAL to MEDIAL approach. Specimen radiograph was performed showing calcifications in numerous samples. Specimens with calcifications are identified for pathology. Lesion quadrant: LOWER OUTER QUADRANT RIGHT breast At the conclusion of the procedure, a coil shaped tissue marker clip was deployed into the biopsy cavity. Follow-up 2-view mammogram was performed and dictated separately. IMPRESSION: Stereotactic-guided biopsy of RIGHT breast calcifications. No apparent complications. Electronically Signed: By: Nolon Nations M.D. On: 12/09/2017 13:47   Mm Rt Plc Breast Loc Dev   1st Lesion  Inc Mammo Guide  Result Date: 12/29/2017 CLINICAL DATA:  Right breast cancer for needle localization prior to surgery. EXAM: NEEDLE LOCALIZATION OF THE RIGHT BREAST WITH MAMMO GUIDANCE COMPARISON:  Previous exams. FINDINGS: Patient presents for needle localization prior to right breast surgery. I met with the patient and we discussed the procedure of needle localization including benefits and alternatives. We discussed the high likelihood of a successful procedure. We discussed the risks of the procedure, including infection, bleeding, tissue injury, and further surgery. Informed, written consent was given. The usual time-out protocol was performed immediately prior to the procedure. Using mammographic guidance, sterile technique,  1% lidocaine and a 15 cm modified Kopans needle, biopsy clip localized using lateral approach. The images were marked for Dr. Windell Moment. IMPRESSION: Needle localization right breast. No apparent complications. Electronically Signed   By: Abelardo Diesel M.D.   On: 12/29/2017 08:49     Assessment and plan- Patient is a 77 y.o. female with right breast DCIS status post lumpectomy  I discussed the results of the pathology with the patient in detail which showed a high-grade right breast DCIS that was ER positive.  There was no evidence of invasive carcinoma.  There was a focally close margin of less than 1.5 mm anterior margin.  Dr. Donella Stade from radiation oncology plans to give a radiation boost to this margin and therefore patient will not be going for a reexcision at this time.  I again discussed with the patient that there would be no role for chemotherapy and DCIS.  Given that patient has ER positive disease she would be a candidate for hormone therapy for 5 years and I would recommend Arimidex 1 mg p.o. daily.  Discussed risks and benefits of Arimidex including all but not limited to hot flashes, arthralgias, worsening bone density, mood swings.  Patient understands and agrees to proceed as planned.  Treatment will be given with a curative intent.  Written information about Arimidex has been given to the patient.  She will also take calcium 1200 mg along with vitamin D 800 international units.  She will start taking Arimidex tentatively after she completes radiation treatment which would be sometime in January 2020.  She will call us as she is nearing end of radiation and we will prescribe her Arimidex at that time.  I will see her back in mid February with a CMP to see how she is tolerating her Arimidex.  We will get an interim bone density scan in 4 to 6 weeks time  Cancer Staging DCIS (ductal carcinoma in situ) of breast Staging form: Breast, AJCC 8th Edition - Clinical stage from 12/21/2017:  Stage 0 (cTis (DCIS), cN0, cM0, ER: Unknown, PR: Unknown, HER2: Unknown) - Signed by Sindy Guadeloupe, MD on 12/24/2017     Visit Diagnosis 1. Ductal carcinoma in situ (DCIS) of right breast   2. Goals of care, counseling/discussion  Dr. Randa Evens, MD, MPH [facility] [phone] 01/07/2018 2:04 PM

## 2018-01-11 NOTE — Progress Notes (Signed)
  Oncology Nurse Navigator Documentation  Navigator Location: [facility]Med Onc (01/06/18 1330)   )Navigator Encounter Type: Clinic/MDC;Initial RadOnc;Initial MedOnc (01/06/18 1330)                     Patient Visit Type: Follow-up (01/06/18 1330) Treatment Phase: Pre-Tx/Tx Discussion (01/06/18 1330) Barriers/Navigation Needs: Education;Coordination of Care (01/06/18 1330)   Interventions: Psycho-social support;Coordination of Care (01/06/18 1330)                      Time Spent with Patient: 90 (01/06/18 1330)   Supported patient,and husband at initial Rad/Onc consult, and follow-up Med/Onc visit.  Simulation scheduled for 01/17/18.

## 2018-01-17 ENCOUNTER — Ambulatory Visit
Admission: RE | Admit: 2018-01-17 | Discharge: 2018-01-17 | Disposition: A | Discharge status: Home / Self Care | Payer: Medicare HMO | Source: Ambulatory Visit | Attending: Radiation Oncology | Admitting: Radiation Oncology

## 2018-01-17 DIAGNOSIS — H401131 Primary open-angle glaucoma, bilateral, mild stage: Secondary | ICD-10-CM | POA: Diagnosis not present

## 2018-01-17 DIAGNOSIS — Z510 Encounter for antineoplastic radiation therapy: Secondary | ICD-10-CM | POA: Insufficient documentation

## 2018-01-17 DIAGNOSIS — D0511 Intraductal carcinoma in situ of right breast: Secondary | ICD-10-CM | POA: Insufficient documentation

## 2018-01-17 DIAGNOSIS — Z170 Estrogen receptor positive status [ER+]: Secondary | ICD-10-CM | POA: Diagnosis not present

## 2018-01-20 DIAGNOSIS — D0511 Intraductal carcinoma in situ of right breast: Secondary | ICD-10-CM | POA: Diagnosis not present

## 2018-01-20 DIAGNOSIS — Z170 Estrogen receptor positive status [ER+]: Secondary | ICD-10-CM | POA: Diagnosis not present

## 2018-01-20 DIAGNOSIS — Z510 Encounter for antineoplastic radiation therapy: Secondary | ICD-10-CM | POA: Diagnosis not present

## 2018-01-21 ENCOUNTER — Other Ambulatory Visit: Payer: Self-pay | Admitting: *Deleted

## 2018-01-21 DIAGNOSIS — D0511 Intraductal carcinoma in situ of right breast: Secondary | ICD-10-CM

## 2018-01-24 ENCOUNTER — Ambulatory Visit
Admission: RE | Admit: 2018-01-24 | Discharge: 2018-01-24 | Disposition: A | Discharge status: Home / Self Care | Payer: Medicare HMO | Source: Ambulatory Visit | Attending: Radiation Oncology | Admitting: Radiation Oncology

## 2018-01-24 DIAGNOSIS — D0511 Intraductal carcinoma in situ of right breast: Secondary | ICD-10-CM | POA: Diagnosis not present

## 2018-01-24 DIAGNOSIS — Z510 Encounter for antineoplastic radiation therapy: Secondary | ICD-10-CM | POA: Diagnosis not present

## 2018-01-24 DIAGNOSIS — Z170 Estrogen receptor positive status [ER+]: Secondary | ICD-10-CM | POA: Diagnosis not present

## 2018-01-25 ENCOUNTER — Ambulatory Visit
Admission: RE | Admit: 2018-01-25 | Discharge: 2018-01-25 | Disposition: A | Discharge status: Home / Self Care | Payer: Medicare HMO | Source: Ambulatory Visit | Attending: Radiation Oncology | Admitting: Radiation Oncology

## 2018-01-25 DIAGNOSIS — Z510 Encounter for antineoplastic radiation therapy: Secondary | ICD-10-CM | POA: Diagnosis not present

## 2018-01-25 DIAGNOSIS — D0511 Intraductal carcinoma in situ of right breast: Secondary | ICD-10-CM | POA: Diagnosis not present

## 2018-01-25 DIAGNOSIS — Z170 Estrogen receptor positive status [ER+]: Secondary | ICD-10-CM | POA: Diagnosis not present

## 2018-01-26 ENCOUNTER — Ambulatory Visit
Admission: RE | Admit: 2018-01-26 | Discharge: 2018-01-26 | Disposition: A | Discharge status: Home / Self Care | Payer: Medicare HMO | Source: Ambulatory Visit | Attending: Radiation Oncology | Admitting: Radiation Oncology

## 2018-01-26 DIAGNOSIS — Z170 Estrogen receptor positive status [ER+]: Secondary | ICD-10-CM | POA: Diagnosis not present

## 2018-01-26 DIAGNOSIS — D0511 Intraductal carcinoma in situ of right breast: Secondary | ICD-10-CM | POA: Diagnosis not present

## 2018-01-26 DIAGNOSIS — Z510 Encounter for antineoplastic radiation therapy: Secondary | ICD-10-CM | POA: Diagnosis not present

## 2018-01-27 ENCOUNTER — Ambulatory Visit
Admission: RE | Admit: 2018-01-27 | Discharge: 2018-01-27 | Disposition: A | Discharge status: Home / Self Care | Payer: Medicare HMO | Source: Ambulatory Visit | Attending: Radiation Oncology | Admitting: Radiation Oncology

## 2018-01-27 DIAGNOSIS — Z510 Encounter for antineoplastic radiation therapy: Secondary | ICD-10-CM | POA: Diagnosis not present

## 2018-01-27 DIAGNOSIS — D0511 Intraductal carcinoma in situ of right breast: Secondary | ICD-10-CM | POA: Diagnosis not present

## 2018-01-27 DIAGNOSIS — Z170 Estrogen receptor positive status [ER+]: Secondary | ICD-10-CM | POA: Diagnosis not present

## 2018-01-28 ENCOUNTER — Ambulatory Visit
Admission: RE | Admit: 2018-01-28 | Discharge: 2018-01-28 | Disposition: A | Discharge status: Home / Self Care | Payer: Medicare HMO | Source: Ambulatory Visit | Attending: Radiation Oncology | Admitting: Radiation Oncology

## 2018-01-28 DIAGNOSIS — Z510 Encounter for antineoplastic radiation therapy: Secondary | ICD-10-CM | POA: Diagnosis not present

## 2018-01-28 DIAGNOSIS — D0511 Intraductal carcinoma in situ of right breast: Secondary | ICD-10-CM | POA: Diagnosis not present

## 2018-01-28 DIAGNOSIS — Z170 Estrogen receptor positive status [ER+]: Secondary | ICD-10-CM | POA: Diagnosis not present

## 2018-01-31 ENCOUNTER — Ambulatory Visit
Admission: RE | Admit: 2018-01-31 | Discharge: 2018-01-31 | Disposition: A | Discharge status: Home / Self Care | Payer: Medicare HMO | Source: Ambulatory Visit | Attending: Radiation Oncology | Admitting: Radiation Oncology

## 2018-01-31 DIAGNOSIS — D0511 Intraductal carcinoma in situ of right breast: Secondary | ICD-10-CM | POA: Diagnosis not present

## 2018-01-31 DIAGNOSIS — Z510 Encounter for antineoplastic radiation therapy: Secondary | ICD-10-CM | POA: Diagnosis not present

## 2018-01-31 DIAGNOSIS — Z170 Estrogen receptor positive status [ER+]: Secondary | ICD-10-CM | POA: Diagnosis not present

## 2018-02-01 ENCOUNTER — Ambulatory Visit
Admission: RE | Admit: 2018-02-01 | Discharge: 2018-02-01 | Disposition: A | Discharge status: Home / Self Care | Payer: Medicare HMO | Source: Ambulatory Visit | Attending: Radiation Oncology | Admitting: Radiation Oncology

## 2018-02-01 DIAGNOSIS — Z510 Encounter for antineoplastic radiation therapy: Secondary | ICD-10-CM | POA: Diagnosis not present

## 2018-02-01 DIAGNOSIS — Z170 Estrogen receptor positive status [ER+]: Secondary | ICD-10-CM | POA: Diagnosis not present

## 2018-02-01 DIAGNOSIS — D0511 Intraductal carcinoma in situ of right breast: Secondary | ICD-10-CM | POA: Diagnosis not present

## 2018-02-02 ENCOUNTER — Ambulatory Visit
Admission: RE | Admit: 2018-02-02 | Discharge: 2018-02-02 | Disposition: A | Discharge status: Home / Self Care | Payer: Medicare HMO | Source: Ambulatory Visit | Attending: Radiation Oncology | Admitting: Radiation Oncology

## 2018-02-02 DIAGNOSIS — Z170 Estrogen receptor positive status [ER+]: Secondary | ICD-10-CM | POA: Diagnosis not present

## 2018-02-02 DIAGNOSIS — Z510 Encounter for antineoplastic radiation therapy: Secondary | ICD-10-CM | POA: Diagnosis not present

## 2018-02-02 DIAGNOSIS — D0511 Intraductal carcinoma in situ of right breast: Secondary | ICD-10-CM | POA: Diagnosis not present

## 2018-02-07 ENCOUNTER — Ambulatory Visit
Admission: RE | Admit: 2018-02-07 | Discharge: 2018-02-07 | Disposition: A | Discharge status: Home / Self Care | Payer: Medicare HMO | Source: Ambulatory Visit | Attending: Radiation Oncology | Admitting: Radiation Oncology

## 2018-02-07 ENCOUNTER — Ambulatory Visit: Payer: Medicare HMO

## 2018-02-07 DIAGNOSIS — D0511 Intraductal carcinoma in situ of right breast: Secondary | ICD-10-CM | POA: Insufficient documentation

## 2018-02-07 DIAGNOSIS — Z510 Encounter for antineoplastic radiation therapy: Secondary | ICD-10-CM | POA: Insufficient documentation

## 2018-02-07 DIAGNOSIS — Z170 Estrogen receptor positive status [ER+]: Secondary | ICD-10-CM | POA: Diagnosis not present

## 2018-02-08 ENCOUNTER — Ambulatory Visit
Admission: RE | Admit: 2018-02-08 | Discharge: 2018-02-08 | Disposition: A | Discharge status: Home / Self Care | Payer: Medicare HMO | Source: Ambulatory Visit | Attending: Radiation Oncology | Admitting: Radiation Oncology

## 2018-02-08 DIAGNOSIS — Z510 Encounter for antineoplastic radiation therapy: Secondary | ICD-10-CM | POA: Diagnosis not present

## 2018-02-08 DIAGNOSIS — Z170 Estrogen receptor positive status [ER+]: Secondary | ICD-10-CM | POA: Diagnosis not present

## 2018-02-08 DIAGNOSIS — D0511 Intraductal carcinoma in situ of right breast: Secondary | ICD-10-CM | POA: Diagnosis not present

## 2018-02-09 ENCOUNTER — Inpatient Hospital Stay: Payer: Medicare HMO | Attending: Radiation Oncology

## 2018-02-09 ENCOUNTER — Other Ambulatory Visit: Payer: Self-pay

## 2018-02-09 ENCOUNTER — Ambulatory Visit
Admission: RE | Admit: 2018-02-09 | Discharge: 2018-02-09 | Disposition: A | Discharge status: Home / Self Care | Payer: Medicare HMO | Source: Ambulatory Visit | Attending: Radiation Oncology | Admitting: Radiation Oncology

## 2018-02-09 DIAGNOSIS — D0511 Intraductal carcinoma in situ of right breast: Secondary | ICD-10-CM | POA: Diagnosis not present

## 2018-02-09 DIAGNOSIS — Z510 Encounter for antineoplastic radiation therapy: Secondary | ICD-10-CM | POA: Diagnosis not present

## 2018-02-09 DIAGNOSIS — Z170 Estrogen receptor positive status [ER+]: Secondary | ICD-10-CM | POA: Diagnosis not present

## 2018-02-09 LAB — CBC
HCT: 41.8 % (ref 36.0–46.0)
HEMOGLOBIN: 13 g/dL (ref 12.0–15.0)
MCH: 26.2 pg (ref 26.0–34.0)
MCHC: 31.1 g/dL (ref 30.0–36.0)
MCV: 84.1 fL (ref 80.0–100.0)
NRBC: 0 % (ref 0.0–0.2)
Platelets: 298 10*3/uL (ref 150–400)
RBC: 4.97 MIL/uL (ref 3.87–5.11)
RDW: 13.7 % (ref 11.5–15.5)
WBC: 6.7 10*3/uL (ref 4.0–10.5)

## 2018-02-10 ENCOUNTER — Ambulatory Visit
Admission: RE | Admit: 2018-02-10 | Discharge: 2018-02-10 | Disposition: A | Discharge status: Home / Self Care | Payer: Medicare HMO | Source: Ambulatory Visit | Attending: Radiation Oncology | Admitting: Radiation Oncology

## 2018-02-10 DIAGNOSIS — D0511 Intraductal carcinoma in situ of right breast: Secondary | ICD-10-CM | POA: Diagnosis not present

## 2018-02-10 DIAGNOSIS — Z170 Estrogen receptor positive status [ER+]: Secondary | ICD-10-CM | POA: Diagnosis not present

## 2018-02-10 DIAGNOSIS — Z510 Encounter for antineoplastic radiation therapy: Secondary | ICD-10-CM | POA: Diagnosis not present

## 2018-02-11 ENCOUNTER — Ambulatory Visit
Admission: RE | Admit: 2018-02-11 | Discharge: 2018-02-11 | Disposition: A | Discharge status: Home / Self Care | Payer: Medicare HMO | Source: Ambulatory Visit | Attending: Radiation Oncology | Admitting: Radiation Oncology

## 2018-02-11 DIAGNOSIS — D0511 Intraductal carcinoma in situ of right breast: Secondary | ICD-10-CM | POA: Diagnosis not present

## 2018-02-11 DIAGNOSIS — Z510 Encounter for antineoplastic radiation therapy: Secondary | ICD-10-CM | POA: Diagnosis not present

## 2018-02-11 DIAGNOSIS — Z170 Estrogen receptor positive status [ER+]: Secondary | ICD-10-CM | POA: Diagnosis not present

## 2018-02-14 ENCOUNTER — Ambulatory Visit
Admission: RE | Admit: 2018-02-14 | Discharge: 2018-02-14 | Disposition: A | Discharge status: Home / Self Care | Payer: Medicare HMO | Source: Ambulatory Visit | Attending: Radiation Oncology | Admitting: Radiation Oncology

## 2018-02-14 DIAGNOSIS — Z170 Estrogen receptor positive status [ER+]: Secondary | ICD-10-CM | POA: Diagnosis not present

## 2018-02-14 DIAGNOSIS — D0511 Intraductal carcinoma in situ of right breast: Secondary | ICD-10-CM | POA: Diagnosis not present

## 2018-02-14 DIAGNOSIS — Z510 Encounter for antineoplastic radiation therapy: Secondary | ICD-10-CM | POA: Diagnosis not present

## 2018-02-15 ENCOUNTER — Ambulatory Visit
Admission: RE | Admit: 2018-02-15 | Discharge: 2018-02-15 | Disposition: A | Discharge status: Home / Self Care | Payer: Medicare HMO | Source: Ambulatory Visit | Attending: Radiation Oncology | Admitting: Radiation Oncology

## 2018-02-15 DIAGNOSIS — Z510 Encounter for antineoplastic radiation therapy: Secondary | ICD-10-CM | POA: Diagnosis not present

## 2018-02-15 DIAGNOSIS — D0511 Intraductal carcinoma in situ of right breast: Secondary | ICD-10-CM | POA: Diagnosis not present

## 2018-02-15 DIAGNOSIS — Z170 Estrogen receptor positive status [ER+]: Secondary | ICD-10-CM | POA: Diagnosis not present

## 2018-02-16 ENCOUNTER — Ambulatory Visit
Admission: RE | Admit: 2018-02-16 | Discharge: 2018-02-16 | Disposition: A | Discharge status: Home / Self Care | Payer: Medicare HMO | Source: Ambulatory Visit | Attending: Radiation Oncology | Admitting: Radiation Oncology

## 2018-02-16 ENCOUNTER — Ambulatory Visit
Admission: RE | Admit: 2018-02-16 | Discharge: 2018-02-16 | Disposition: A | Discharge status: Home / Self Care | Payer: Medicare HMO | Source: Ambulatory Visit | Attending: Oncology | Admitting: Oncology

## 2018-02-16 DIAGNOSIS — D0511 Intraductal carcinoma in situ of right breast: Secondary | ICD-10-CM | POA: Diagnosis not present

## 2018-02-16 DIAGNOSIS — Z780 Asymptomatic menopausal state: Secondary | ICD-10-CM | POA: Insufficient documentation

## 2018-02-16 DIAGNOSIS — Z170 Estrogen receptor positive status [ER+]: Secondary | ICD-10-CM | POA: Diagnosis not present

## 2018-02-16 DIAGNOSIS — M8588 Other specified disorders of bone density and structure, other site: Secondary | ICD-10-CM | POA: Diagnosis not present

## 2018-02-16 DIAGNOSIS — Z510 Encounter for antineoplastic radiation therapy: Secondary | ICD-10-CM | POA: Diagnosis not present

## 2018-02-16 HISTORY — DX: Personal history of irradiation: Z92.3

## 2018-02-17 ENCOUNTER — Ambulatory Visit
Admission: RE | Admit: 2018-02-17 | Discharge: 2018-02-17 | Disposition: A | Discharge status: Home / Self Care | Payer: Medicare HMO | Source: Ambulatory Visit | Attending: Radiation Oncology | Admitting: Radiation Oncology

## 2018-02-17 DIAGNOSIS — Z170 Estrogen receptor positive status [ER+]: Secondary | ICD-10-CM | POA: Diagnosis not present

## 2018-02-17 DIAGNOSIS — Z510 Encounter for antineoplastic radiation therapy: Secondary | ICD-10-CM | POA: Diagnosis not present

## 2018-02-17 DIAGNOSIS — D0511 Intraductal carcinoma in situ of right breast: Secondary | ICD-10-CM | POA: Diagnosis not present

## 2018-02-18 ENCOUNTER — Ambulatory Visit
Admission: RE | Admit: 2018-02-18 | Discharge: 2018-02-18 | Disposition: A | Discharge status: Home / Self Care | Payer: Medicare HMO | Source: Ambulatory Visit | Attending: Radiation Oncology | Admitting: Radiation Oncology

## 2018-02-18 DIAGNOSIS — D0511 Intraductal carcinoma in situ of right breast: Secondary | ICD-10-CM | POA: Diagnosis not present

## 2018-02-18 DIAGNOSIS — Z510 Encounter for antineoplastic radiation therapy: Secondary | ICD-10-CM | POA: Diagnosis not present

## 2018-02-18 DIAGNOSIS — Z170 Estrogen receptor positive status [ER+]: Secondary | ICD-10-CM | POA: Diagnosis not present

## 2018-02-21 ENCOUNTER — Ambulatory Visit
Admission: RE | Admit: 2018-02-21 | Discharge: 2018-02-21 | Disposition: A | Discharge status: Home / Self Care | Payer: Medicare HMO | Source: Ambulatory Visit | Attending: Radiation Oncology | Admitting: Radiation Oncology

## 2018-02-21 DIAGNOSIS — D0511 Intraductal carcinoma in situ of right breast: Secondary | ICD-10-CM | POA: Diagnosis not present

## 2018-02-21 DIAGNOSIS — Z510 Encounter for antineoplastic radiation therapy: Secondary | ICD-10-CM | POA: Diagnosis not present

## 2018-02-21 DIAGNOSIS — Z170 Estrogen receptor positive status [ER+]: Secondary | ICD-10-CM | POA: Diagnosis not present

## 2018-02-22 ENCOUNTER — Ambulatory Visit: Payer: Medicare HMO

## 2018-02-22 ENCOUNTER — Ambulatory Visit
Admission: RE | Admit: 2018-02-22 | Discharge: 2018-02-22 | Disposition: A | Discharge status: Home / Self Care | Payer: Medicare HMO | Source: Ambulatory Visit | Attending: Radiation Oncology | Admitting: Radiation Oncology

## 2018-02-22 DIAGNOSIS — Z170 Estrogen receptor positive status [ER+]: Secondary | ICD-10-CM | POA: Diagnosis not present

## 2018-02-22 DIAGNOSIS — Z510 Encounter for antineoplastic radiation therapy: Secondary | ICD-10-CM | POA: Diagnosis not present

## 2018-02-22 DIAGNOSIS — D0511 Intraductal carcinoma in situ of right breast: Secondary | ICD-10-CM | POA: Diagnosis not present

## 2018-02-23 ENCOUNTER — Other Ambulatory Visit: Payer: Self-pay

## 2018-02-23 ENCOUNTER — Inpatient Hospital Stay: Payer: Medicare HMO

## 2018-02-23 ENCOUNTER — Ambulatory Visit
Admission: RE | Admit: 2018-02-23 | Discharge: 2018-02-23 | Disposition: A | Discharge status: Home / Self Care | Payer: Medicare HMO | Source: Ambulatory Visit | Attending: Radiation Oncology | Admitting: Radiation Oncology

## 2018-02-23 DIAGNOSIS — D0511 Intraductal carcinoma in situ of right breast: Secondary | ICD-10-CM | POA: Diagnosis not present

## 2018-02-23 DIAGNOSIS — Z510 Encounter for antineoplastic radiation therapy: Secondary | ICD-10-CM | POA: Diagnosis not present

## 2018-02-23 DIAGNOSIS — Z170 Estrogen receptor positive status [ER+]: Secondary | ICD-10-CM | POA: Diagnosis not present

## 2018-02-23 LAB — CBC
HEMATOCRIT: 41.5 % (ref 36.0–46.0)
HEMOGLOBIN: 13 g/dL (ref 12.0–15.0)
MCH: 26.5 pg (ref 26.0–34.0)
MCHC: 31.3 g/dL (ref 30.0–36.0)
MCV: 84.5 fL (ref 80.0–100.0)
Platelets: 281 10*3/uL (ref 150–400)
RBC: 4.91 MIL/uL (ref 3.87–5.11)
RDW: 13.5 % (ref 11.5–15.5)
WBC: 7.3 10*3/uL (ref 4.0–10.5)
nRBC: 0 % (ref 0.0–0.2)

## 2018-02-24 ENCOUNTER — Ambulatory Visit
Admission: RE | Admit: 2018-02-24 | Discharge: 2018-02-24 | Disposition: A | Discharge status: Home / Self Care | Payer: Medicare HMO | Source: Ambulatory Visit | Attending: Radiation Oncology | Admitting: Radiation Oncology

## 2018-02-24 DIAGNOSIS — Z170 Estrogen receptor positive status [ER+]: Secondary | ICD-10-CM | POA: Diagnosis not present

## 2018-02-24 DIAGNOSIS — Z510 Encounter for antineoplastic radiation therapy: Secondary | ICD-10-CM | POA: Diagnosis not present

## 2018-02-24 DIAGNOSIS — D0511 Intraductal carcinoma in situ of right breast: Secondary | ICD-10-CM | POA: Diagnosis not present

## 2018-02-25 ENCOUNTER — Ambulatory Visit
Admission: RE | Admit: 2018-02-25 | Discharge: 2018-02-25 | Disposition: A | Discharge status: Home / Self Care | Payer: Medicare HMO | Source: Ambulatory Visit | Attending: Radiation Oncology | Admitting: Radiation Oncology

## 2018-02-25 DIAGNOSIS — Z510 Encounter for antineoplastic radiation therapy: Secondary | ICD-10-CM | POA: Diagnosis not present

## 2018-02-25 DIAGNOSIS — D0511 Intraductal carcinoma in situ of right breast: Secondary | ICD-10-CM | POA: Diagnosis not present

## 2018-02-28 ENCOUNTER — Ambulatory Visit
Admission: RE | Admit: 2018-02-28 | Discharge: 2018-02-28 | Disposition: A | Discharge status: Home / Self Care | Payer: Medicare HMO | Source: Ambulatory Visit | Attending: Radiation Oncology | Admitting: Radiation Oncology

## 2018-02-28 DIAGNOSIS — D0511 Intraductal carcinoma in situ of right breast: Secondary | ICD-10-CM | POA: Diagnosis not present

## 2018-02-28 DIAGNOSIS — Z510 Encounter for antineoplastic radiation therapy: Secondary | ICD-10-CM | POA: Diagnosis not present

## 2018-02-28 DIAGNOSIS — Z170 Estrogen receptor positive status [ER+]: Secondary | ICD-10-CM | POA: Diagnosis not present

## 2018-03-01 ENCOUNTER — Ambulatory Visit
Admission: RE | Admit: 2018-03-01 | Discharge: 2018-03-01 | Disposition: A | Discharge status: Home / Self Care | Payer: Medicare HMO | Source: Ambulatory Visit | Attending: Radiation Oncology | Admitting: Radiation Oncology

## 2018-03-01 DIAGNOSIS — Z170 Estrogen receptor positive status [ER+]: Secondary | ICD-10-CM | POA: Diagnosis not present

## 2018-03-01 DIAGNOSIS — D0511 Intraductal carcinoma in situ of right breast: Secondary | ICD-10-CM | POA: Diagnosis not present

## 2018-03-01 DIAGNOSIS — Z510 Encounter for antineoplastic radiation therapy: Secondary | ICD-10-CM | POA: Diagnosis not present

## 2018-03-03 ENCOUNTER — Ambulatory Visit
Admission: RE | Admit: 2018-03-03 | Discharge: 2018-03-03 | Disposition: A | Discharge status: Home / Self Care | Payer: Medicare HMO | Source: Ambulatory Visit | Attending: Radiation Oncology | Admitting: Radiation Oncology

## 2018-03-03 ENCOUNTER — Telehealth: Payer: Self-pay | Admitting: *Deleted

## 2018-03-03 DIAGNOSIS — Z170 Estrogen receptor positive status [ER+]: Secondary | ICD-10-CM | POA: Diagnosis not present

## 2018-03-03 DIAGNOSIS — Z510 Encounter for antineoplastic radiation therapy: Secondary | ICD-10-CM | POA: Diagnosis not present

## 2018-03-03 DIAGNOSIS — D0511 Intraductal carcinoma in situ of right breast: Secondary | ICD-10-CM | POA: Diagnosis not present

## 2018-03-03 MED ORDER — ANASTROZOLE 1 MG PO TABS: 1.0000 mg | ORAL_TABLET | Freq: Every day | ORAL | 2 refills | Status: DC

## 2018-03-03 NOTE — Telephone Encounter (Signed)
Pt called to say that she finishes radiation tom.  I will send her arimidex toher pharmacy at [place] agreeable.

## 2018-03-04 ENCOUNTER — Ambulatory Visit
Admission: RE | Admit: 2018-03-04 | Discharge: 2018-03-04 | Disposition: A | Discharge status: Home / Self Care | Payer: Medicare HMO | Source: Ambulatory Visit | Attending: Radiation Oncology | Admitting: Radiation Oncology

## 2018-03-04 DIAGNOSIS — D0511 Intraductal carcinoma in situ of right breast: Secondary | ICD-10-CM | POA: Diagnosis not present

## 2018-03-04 DIAGNOSIS — Z510 Encounter for antineoplastic radiation therapy: Secondary | ICD-10-CM | POA: Diagnosis not present

## 2018-03-04 DIAGNOSIS — Z170 Estrogen receptor positive status [ER+]: Secondary | ICD-10-CM | POA: Diagnosis not present

## 2018-03-07 ENCOUNTER — Ambulatory Visit: Payer: Medicare HMO

## 2018-03-08 ENCOUNTER — Ambulatory Visit: Payer: Medicare HMO

## 2018-03-10 ENCOUNTER — Ambulatory Visit: Payer: Medicare HMO

## 2018-03-11 ENCOUNTER — Ambulatory Visit: Payer: Medicare HMO

## 2018-03-14 ENCOUNTER — Ambulatory Visit: Payer: Medicare HMO

## 2018-03-15 ENCOUNTER — Ambulatory Visit: Payer: Medicare HMO

## 2018-03-16 ENCOUNTER — Ambulatory Visit: Payer: Medicare HMO

## 2018-03-17 ENCOUNTER — Ambulatory Visit: Payer: Medicare HMO

## 2018-03-18 ENCOUNTER — Ambulatory Visit: Payer: Medicare HMO

## 2018-03-21 ENCOUNTER — Ambulatory Visit: Payer: Medicare HMO

## 2018-04-08 ENCOUNTER — Encounter: Payer: Self-pay | Admitting: Radiation Oncology

## 2018-04-08 ENCOUNTER — Other Ambulatory Visit: Payer: Self-pay

## 2018-04-08 ENCOUNTER — Ambulatory Visit
Admission: RE | Admit: 2018-04-08 | Discharge: 2018-04-08 | Disposition: A | Discharge status: Home / Self Care | Payer: Medicare HMO | Source: Ambulatory Visit | Attending: Radiation Oncology | Admitting: Radiation Oncology

## 2018-04-08 VITALS — BP 147/74 | HR 66 | Temp 95.8°F | Resp 18 | Wt 192.0 lb

## 2018-04-08 DIAGNOSIS — D0511 Intraductal carcinoma in situ of right breast: Secondary | ICD-10-CM | POA: Insufficient documentation

## 2018-04-08 DIAGNOSIS — Z923 Personal history of irradiation: Secondary | ICD-10-CM | POA: Insufficient documentation

## 2018-04-08 NOTE — Progress Notes (Signed)
Radiation Oncology Follow up Note  Name: Susan Montgomery   Date:   04/08/2018 MRN:  [id number] DOB: 04/12/1940    This 78 y.o. female presents to the clinic today for one-month follow-up status post whole breast radiation to her right breast for ER/PR positive ductal carcinoma in situ.  REFERRING PROVIDER: Maryland Pink, MD  HPI: patient is a 78 year old female now out 1 month having completed whole breast radiation to her right breast first ER/PR positive ductal carcinoma in situ. Seen today in routine follow-up she is doing well. She specifically denies breast tenderness cough or bone pain..she is started on arimadex time that well without side effect.  COMPLICATIONS OF TREATMENT: none  FOLLOW UP COMPLIANCE: keeps appointments   PHYSICAL EXAM:  BP (!) 147/74 (BP Location: Left Arm)   Pulse 66   Temp (!) 95.8 F (35.4 C)   Resp 18   Wt 192 lb 0.3 oz (87.1 kg)   BMI 32.96 kg/m  Lungs are clear to A&P cardiac examination essentially unremarkable with regular rate and rhythm. No dominant mass or nodularity is noted in either breast in 2 positions examined. Incision is well-healed. No axillary or supraclavicular adenopathy is appreciated. Cosmetic result is excellent.Well-developed well-nourished patient in NAD. HEENT reveals PERLA, EOMI, discs not visualized.  Oral cavity is clear. No oral mucosal lesions are identified. Neck is clear without evidence of cervical or supraclavicular adenopathy. Lungs are clear to A&P. Cardiac examination is essentially unremarkable with regular rate and rhythm without murmur rub or thrill. Abdomen is benign with no organomegaly or masses noted. Motor sensory and DTR levels are equal and symmetric in the upper and lower extremities. Cranial nerves II through XII are grossly intact. Proprioception is intact. No peripheral adenopathy or edema is identified. No motor or sensory levels are noted. Crude visual fields are within normal range.  RADIOLOGY RESULTS: no  current films for review  PLAN: present time she is one month out doing well. She's having no side effects or complaints. She is start arimadex without side effect. I have asked to see her back in 4-5 months for follow-up. Patient knows to call with any concerns at any time.  I would like to take this opportunity to thank you for allowing me to participate in the care of your patient.Noreene Filbert, MD

## 2018-04-15 DIAGNOSIS — Z853 Personal history of malignant neoplasm of breast: Secondary | ICD-10-CM | POA: Diagnosis not present

## 2018-04-19 DIAGNOSIS — E119 Type 2 diabetes mellitus without complications: Secondary | ICD-10-CM | POA: Diagnosis not present

## 2018-04-26 ENCOUNTER — Inpatient Hospital Stay: Payer: Medicare HMO | Attending: Oncology

## 2018-04-26 ENCOUNTER — Inpatient Hospital Stay: Payer: Medicare HMO | Attending: Oncology | Admitting: Oncology

## 2018-04-26 VITALS — BP 124/75 | HR 86 | Temp 97.1°F | Resp 18 | Wt 191.6 lb

## 2018-04-26 DIAGNOSIS — N3281 Overactive bladder: Secondary | ICD-10-CM | POA: Diagnosis not present

## 2018-04-26 DIAGNOSIS — E119 Type 2 diabetes mellitus without complications: Secondary | ICD-10-CM | POA: Diagnosis not present

## 2018-04-26 DIAGNOSIS — N289 Disorder of kidney and ureter, unspecified: Secondary | ICD-10-CM | POA: Diagnosis not present

## 2018-04-26 DIAGNOSIS — Z170 Estrogen receptor positive status [ER+]: Secondary | ICD-10-CM | POA: Diagnosis not present

## 2018-04-26 DIAGNOSIS — Z853 Personal history of malignant neoplasm of breast: Secondary | ICD-10-CM

## 2018-04-26 DIAGNOSIS — Z79811 Long term (current) use of aromatase inhibitors: Secondary | ICD-10-CM | POA: Diagnosis not present

## 2018-04-26 DIAGNOSIS — D0511 Intraductal carcinoma in situ of right breast: Secondary | ICD-10-CM | POA: Insufficient documentation

## 2018-04-26 DIAGNOSIS — I10 Essential (primary) hypertension: Secondary | ICD-10-CM | POA: Diagnosis not present

## 2018-04-26 DIAGNOSIS — Z08 Encounter for follow-up examination after completed treatment for malignant neoplasm: Secondary | ICD-10-CM

## 2018-04-26 DIAGNOSIS — Z5181 Encounter for therapeutic drug level monitoring: Secondary | ICD-10-CM

## 2018-04-26 LAB — COMPREHENSIVE METABOLIC PANEL
ALBUMIN: 3.9 g/dL (ref 3.5–5.0)
ALK PHOS: 60 U/L (ref 38–126)
ALT: 15 U/L (ref 0–44)
AST: 22 U/L (ref 15–41)
Anion gap: 10 (ref 5–15)
BILIRUBIN TOTAL: 0.7 mg/dL (ref 0.3–1.2)
BUN: 28 mg/dL — ABNORMAL HIGH (ref 8–23)
CO2: 27 mmol/L (ref 22–32)
CREATININE: 1.09 mg/dL — AB (ref 0.44–1.00)
Calcium: 9.3 mg/dL (ref 8.9–10.3)
Chloride: 101 mmol/L (ref 98–111)
GFR calc non Af Amer: 49 mL/min — ABNORMAL LOW (ref >60)
GFR, EST AFRICAN AMERICAN: 56 mL/min — AB (ref >60)
GLUCOSE: 210 mg/dL — AB (ref 70–99)
Potassium: 4 mmol/L (ref 3.5–5.1)
SODIUM: 138 mmol/L (ref 135–145)
Total Protein: 7.6 g/dL (ref 6.5–8.1)

## 2018-04-26 NOTE — Progress Notes (Signed)
Patient offers no complaints today.  Here today for routine follow up regarding DCIS.

## 2018-04-29 ENCOUNTER — Encounter: Payer: Self-pay | Admitting: Oncology

## 2018-04-29 NOTE — Progress Notes (Signed)
Hematology/Oncology Consult note [facility]  [phone] [phone]  Patient Care Team: Maryland Pink, MD as PCP - General (Family Medicine)   Name of the patient: Susan Montgomery  [id number]  [birth year]   Date of visit: 04/29/18  Diagnosis- right breast DCIS ER positive  Chief complaint/ Reason for visit-routine follow-up of breast cancer on Arimidex  Heme/Onc history: patient is a 78 year old female with a past medical history significant for osteopenia hypertension who recently underwent bilateral screening mammogram on 11/10/2017 which showed some suspicious calcifications in the right breast. Diagnostic mammogram revealed faint linear calcifications measuring 2.2 x 0.4 x 0.6 cm. No associated mass or distortion. This was followed by a core biopsy which revealed high-grade DCIS with comedonecrosis. Patient has already met with Dr. Peyton Najjar and surgery is planned on 12/29/2017. Patient previously to birth control Premarin for 10 years her paternal great aunt had history of breast cancer in her 1s. No other family history of breast ovarian pancreatic or colon cancer. She has not had any prior abnormal mammograms or breast biopsies. She otherwise feels well today and denies other complaints. She attained menopause at the age of 19. Age of first. At 20 and first childbirth at the age of 35. She is G3, P3 L3.  Final pathology showed high-grade DCIS with calcifications.Distance from closest anterior margin was less than 0.5 mm focally.  Extent of DCIS was at least 20 mm, grade 3  Interval history-she is tolerating her Arimidex well and reports no significant joint pains or hot flashes.  Her appetite is good and she denies any unintentional weight loss  ECOG PS- 1 Pain scale- 0   Review of systems- Review of Systems  Constitutional: Negative for chills, fever, malaise/fatigue and weight loss.  HENT: Negative for congestion, ear  discharge and nosebleeds.   Eyes: Negative for blurred vision.  Respiratory: Negative for cough, hemoptysis, sputum production, shortness of breath and wheezing.   Cardiovascular: Negative for chest pain, palpitations, orthopnea and claudication.  Gastrointestinal: Negative for abdominal pain, blood in stool, constipation, diarrhea, heartburn, melena, nausea and vomiting.  Genitourinary: Negative for dysuria, flank pain, frequency, hematuria and urgency.  Musculoskeletal: Negative for back pain, joint pain and myalgias.  Skin: Negative for rash.  Neurological: Negative for dizziness, tingling, focal weakness, seizures, weakness and headaches.  Endo/Heme/Allergies: Does not bruise/bleed easily.  Psychiatric/Behavioral: Negative for depression and suicidal ideas. The patient does not have insomnia.      Allergies  Allergen Reactions  . Codeine Anaphylaxis  . Penicillins Anaphylaxis  . Sulfa Antibiotics Swelling     Past Medical History:  Diagnosis Date  . Anxiety   . Cancer ([place])    skin  . Dyspnea    With walking fast or activity around the house  . GERD (gastroesophageal reflux disease)   . History of kidney stones   . Hypertension   . Personal history of radiation therapy      Past Surgical History:  Procedure Laterality Date  . ABDOMINAL HYSTERECTOMY    . ACHILLES TENDON REPAIR Right   . BREAST BIOPSY Right 12/09/2017   -"coil"  clip High-grade comedo type DCIS  . BREAST EXCISIONAL BIOPSY Right 12/29/2017   lumpectomy  . BREAST LUMPECTOMY Right 12/29/2017  . COLONOSCOPY WITH PROPOFOL    . EYE SURGERY     Cataract removal  . PARTIAL MASTECTOMY WITH NEEDLE LOCALIZATION Right 12/29/2017   Procedure: PARTIAL MASTECTOMY WITH NEEDLE LOCALIZATION;  Surgeon: Herbert Pun, MD;  Location: [facility]  ORS;  Service: General;  Laterality: Right;  . SKIN CANCER EXCISION     Basil Cell on the nose    Social History   Socioeconomic History  . Marital status: Married     Spouse name: Not on file  . Number of children: Not on file  . Years of education: Not on file  . Highest education level: Not on file  Occupational History  . Not on file  Social Needs  . Financial resource strain: Not on file  . Food insecurity:    Worry: Not on file    Inability: Not on file  . Transportation needs:    Medical: Not on file    Non-medical: Not on file  Tobacco Use  . Smoking status: Never Smoker  . Smokeless tobacco: Never Used  Substance and Sexual Activity  . Alcohol use: Never    Frequency: Never  . Drug use: Never  . Sexual activity: Not Currently  Lifestyle  . Physical activity:    Days per week: Not on file    Minutes per session: Not on file  . Stress: Not on file  Relationships  . Social connections:    Talks on phone: Not on file    Gets together: Not on file    Attends religious service: Not on file    Active member of club or organization: Not on file    Attends meetings of clubs or organizations: Not on file    Relationship status: Not on file  . Intimate partner violence:    Fear of current or ex partner: Not on file    Emotionally abused: Not on file    Physically abused: Not on file    Forced sexual activity: Not on file  Other Topics Concern  . Not on file  Social History Narrative  . Not on file    Family History  Problem Relation Age of Onset  . Breast cancer Paternal Aunt 73     Current Outpatient Medications:  .  acetaminophen (TYLENOL) 325 MG tablet, Take 650 mg by mouth every 6 (six) hours as needed., Disp: , Rfl:  .  anastrozole (ARIMIDEX) 1 MG tablet, Take 1 tablet (1 mg total) by mouth daily., Disp: 30 tablet, Rfl: 2 .  Calcium Carb-Cholecalciferol (CALCIUM-VITAMIN D) 500-400 MG-UNIT TABS, Take 1 tablet by mouth daily., Disp: , Rfl:  .  citalopram (CELEXA) 20 MG tablet, TAKE 1 TABLET BY MOUTH ONCE DAILY, Disp: , Rfl:  .  latanoprost (XALATAN) 0.005 % ophthalmic solution, Place 1 drop into both eyes at bedtime. ,  Disp: , Rfl: 5 .  metoprolol succinate (TOPROL-XL) 25 MG 24 hr tablet, TAKE ONE TABLET BY MOUTH ONCE DAILY, Disp: , Rfl:  .  omeprazole (PRILOSEC) 20 MG capsule, Take 20 mg by mouth daily., Disp: , Rfl:  .  triamterene-hydrochlorothiazide (MAXZIDE) 75-50 MG tablet, Take 1 tablet by mouth daily., Disp: , Rfl: 3 .  gabapentin (NEURONTIN) 300 MG capsule, Take 1 capsule (300 mg total) by mouth 3 (three) times daily for 14 days., Disp: 42 capsule, Rfl: 0  Physical exam:  Vitals:   04/26/18 1144  BP: 124/75  Pulse: 86  Resp: 18  Temp: (!) 97.1 F (36.2 C)  TempSrc: Tympanic  SpO2: 95%  Weight: 191 lb 9 oz (86.9 kg)   Physical Exam Constitutional:      General: She is not in acute distress. HENT:     Head: Normocephalic and atraumatic.  Eyes:     Pupils:  Pupils are equal, round, and reactive to light.  Neck:     Musculoskeletal: Normal range of motion.  Cardiovascular:     Rate and Rhythm: Normal rate and regular rhythm.     Heart sounds: Normal heart sounds.  Pulmonary:     Effort: Pulmonary effort is normal.     Breath sounds: Normal breath sounds.  Abdominal:     General: Bowel sounds are normal.     Palpations: Abdomen is soft.  Skin:    General: Skin is warm and dry.  Neurological:     Mental Status: She is alert and oriented to person, place, and time.    Breast exam was performed in seated and lying down position. Patient is status post right lumpectomy with a well-healed surgical scar. No evidence of any palpable masses. No evidence of axillary adenopathy. No evidence of any palpable masses or lumps in the left breast. No evidence of leftt axillary adenopathy   CMP Latest Ref Rng & Units 04/26/2018  Glucose 70 - 99 mg/dL 210(H)  BUN 8 - 23 mg/dL 28(H)  Creatinine 0.44 - 1.00 mg/dL 1.09(H)  Sodium 135 - 145 mmol/L 138  Potassium 3.5 - 5.1 mmol/L 4.0  Chloride 98 - 111 mmol/L 101  CO2 22 - 32 mmol/L 27  Calcium 8.9 - 10.3 mg/dL 9.3  Total Protein 6.5 - 8.1 g/dL 7.6   Total Bilirubin 0.3 - 1.2 mg/dL 0.7  Alkaline Phos 38 - 126 U/L 60  AST 15 - 41 U/L 22  ALT 0 - 44 U/L 15   CBC Latest Ref Rng & Units 02/23/2018  WBC 4.0 - 10.5 K/uL 7.3  Hemoglobin 12.0 - 15.0 g/dL 13.0  Hematocrit 36.0 - 46.0 % 41.5  Platelets 150 - 400 K/uL 281    No images are attached to the encounter.  No results found.   Assessment and plan- Patient is a 78 y.o. female with right breast DCIS status post lumpectomy. She is here for routine follow up of breast cancer on arimidex  Overall patient is doing well and tolerating Arimidex without any significant side effects.Bone density scan in December 2019 showed a T score of -1 at the AP spine and was considered normal.  I will see her back in 3 months no labs   Visit Diagnosis 1. Visit for monitoring Arimidex therapy   2. Encounter for follow-up surveillance of breast cancer      Dr. Randa Evens, MD, MPH [facility] at [facility] [phone] 04/29/2018 7:23 AM

## 2018-05-22 ENCOUNTER — Other Ambulatory Visit: Payer: Self-pay | Admitting: Oncology

## 2018-07-25 ENCOUNTER — Inpatient Hospital Stay: Payer: Medicare HMO | Admitting: Oncology

## 2018-08-09 DIAGNOSIS — H401131 Primary open-angle glaucoma, bilateral, mild stage: Secondary | ICD-10-CM | POA: Diagnosis not present

## 2018-08-18 ENCOUNTER — Other Ambulatory Visit: Payer: Self-pay | Admitting: Oncology

## 2018-08-19 ENCOUNTER — Inpatient Hospital Stay: Payer: Medicare HMO | Attending: Oncology | Admitting: Oncology

## 2018-08-19 ENCOUNTER — Other Ambulatory Visit: Payer: Self-pay | Admitting: Oncology

## 2018-08-19 ENCOUNTER — Other Ambulatory Visit: Payer: Self-pay

## 2018-08-19 VITALS — BP 123/80 | HR 98 | Temp 98.7°F | Wt 189.7 lb

## 2018-08-19 DIAGNOSIS — Z5181 Encounter for therapeutic drug level monitoring: Secondary | ICD-10-CM

## 2018-08-19 DIAGNOSIS — Z170 Estrogen receptor positive status [ER+]: Secondary | ICD-10-CM | POA: Diagnosis not present

## 2018-08-19 DIAGNOSIS — Z79811 Long term (current) use of aromatase inhibitors: Secondary | ICD-10-CM | POA: Diagnosis not present

## 2018-08-19 DIAGNOSIS — D0511 Intraductal carcinoma in situ of right breast: Secondary | ICD-10-CM

## 2018-08-21 ENCOUNTER — Encounter: Payer: Self-pay | Admitting: Oncology

## 2018-08-21 NOTE — Progress Notes (Signed)
Hematology/Oncology Consult note [facility]  [phone] [phone]  Patient Care Team: Maryland Pink, MD as PCP - General (Family Medicine)   Name of the patient: Susan Montgomery  [id number]  1940-12-02   Date of visit: 08/21/18  Diagnosis-right breast DCIS ER positive  Chief complaint/ Reason for visit-routine follow-up of breast cancer  Heme/Onc history: patient is a 78 year old female with a past medical history significant for osteopenia hypertension who recently underwent bilateral screening mammogram on 11/10/2017 which showed some suspicious calcifications in the right breast. Diagnostic mammogram revealed faint linear calcifications measuring 2.2 x 0.4 x 0.6 cm. No associated mass or distortion. This was followed by a core biopsy which revealed high-grade DCIS with comedonecrosis. Patient has already met with Dr. Peyton Najjar and surgery is planned on 12/29/2017. Patient previously to birth control Premarin for 10 years her paternal great aunt had history of breast cancer in her 86s. No other family history of breast ovarian pancreatic or colon cancer. She has not had any prior abnormal mammograms or breast biopsies. She otherwise feels well today and denies other complaints. She attained menopause at the age of 18. Age of first. At 89 and first childbirth at the age of 50. She is G3, P3 L3.  Final pathology showed high-grade DCIS with calcifications.Distance from closest anterior margin was less than 0.5 mm focally. Extent of DCIS was at least 20 mm, grade 3  Interval history-patient is tolerating her Arimidex well.  She is continuing to take calcium and vitamin D.  She reports no fatigue, hot flashes or significant joint pains.  ECOG PS- 1 Pain scale- 0 Opioid associated constipation- no  Review of systems- Review of Systems  Constitutional: Negative for chills, fever, malaise/fatigue and weight loss.  HENT: Negative for  congestion, ear discharge and nosebleeds.   Eyes: Negative for blurred vision.  Respiratory: Negative for cough, hemoptysis, sputum production, shortness of breath and wheezing.   Cardiovascular: Negative for chest pain, palpitations, orthopnea and claudication.  Gastrointestinal: Negative for abdominal pain, blood in stool, constipation, diarrhea, heartburn, melena, nausea and vomiting.  Genitourinary: Negative for dysuria, flank pain, frequency, hematuria and urgency.  Musculoskeletal: Negative for back pain, joint pain and myalgias.  Skin: Negative for rash.  Neurological: Negative for dizziness, tingling, focal weakness, seizures, weakness and headaches.  Endo/Heme/Allergies: Does not bruise/bleed easily.  Psychiatric/Behavioral: Negative for depression and suicidal ideas. The patient does not have insomnia.       Allergies  Allergen Reactions  . Codeine Anaphylaxis  . Penicillins Anaphylaxis  . Sulfa Antibiotics Swelling     Past Medical History:  Diagnosis Date  . Anxiety   . Cancer ([place])    skin  . Dyspnea    With walking fast or activity around the house  . GERD (gastroesophageal reflux disease)   . History of kidney stones   . Hypertension   . Personal history of radiation therapy      Past Surgical History:  Procedure Laterality Date  . ABDOMINAL HYSTERECTOMY    . ACHILLES TENDON REPAIR Right   . BREAST BIOPSY Right 12/09/2017   -"coil"  clip High-grade comedo type DCIS  . BREAST EXCISIONAL BIOPSY Right 12/29/2017   lumpectomy  . BREAST LUMPECTOMY Right 12/29/2017  . COLONOSCOPY WITH PROPOFOL    . EYE SURGERY     Cataract removal  . PARTIAL MASTECTOMY WITH NEEDLE LOCALIZATION Right 12/29/2017   Procedure: PARTIAL MASTECTOMY WITH NEEDLE LOCALIZATION;  Surgeon: Herbert Pun, MD;  Location: [facility]  ORS;  Service: General;  Laterality: Right;  . SKIN CANCER EXCISION     Basil Cell on the nose    Social History   Socioeconomic History  . Marital  status: Married    Spouse name: Not on file  . Number of children: Not on file  . Years of education: Not on file  . Highest education level: Not on file  Occupational History  . Not on file  Social Needs  . Financial resource strain: Not on file  . Food insecurity    Worry: Not on file    Inability: Not on file  . Transportation needs    Medical: Not on file    Non-medical: Not on file  Tobacco Use  . Smoking status: Never Smoker  . Smokeless tobacco: Never Used  Substance and Sexual Activity  . Alcohol use: Never    Frequency: Never  . Drug use: Never  . Sexual activity: Not Currently  Lifestyle  . Physical activity    Days per week: Not on file    Minutes per session: Not on file  . Stress: Not on file  Relationships  . Social Herbalist on phone: Not on file    Gets together: Not on file    Attends religious service: Not on file    Active member of club or organization: Not on file    Attends meetings of clubs or organizations: Not on file    Relationship status: Not on file  . Intimate partner violence    Fear of current or ex partner: Not on file    Emotionally abused: Not on file    Physically abused: Not on file    Forced sexual activity: Not on file  Other Topics Concern  . Not on file  Social History Narrative  . Not on file    Family History  Problem Relation Age of Onset  . Breast cancer Paternal Aunt 73     Current Outpatient Medications:  .  acetaminophen (TYLENOL) 325 MG tablet, Take 650 mg by mouth every 6 (six) hours as needed., Disp: , Rfl:  .  Calcium Carb-Cholecalciferol (CALCIUM-VITAMIN D) 500-400 MG-UNIT TABS, Take 1 tablet by mouth daily., Disp: , Rfl:  .  citalopram (CELEXA) 20 MG tablet, TAKE 1 TABLET BY MOUTH ONCE DAILY, Disp: , Rfl:  .  omeprazole (PRILOSEC) 20 MG capsule, Take 20 mg by mouth daily., Disp: , Rfl:  .  triamterene-hydrochlorothiazide (MAXZIDE) 75-50 MG tablet, Take 1 tablet by mouth daily., Disp: , Rfl: 3 .   anastrozole (ARIMIDEX) 1 MG tablet, Take 1 tablet by mouth once daily, Disp: 90 tablet, Rfl: 0 .  gabapentin (NEURONTIN) 300 MG capsule, Take 1 capsule (300 mg total) by mouth 3 (three) times daily for 14 days., Disp: 42 capsule, Rfl: 0 .  latanoprost (XALATAN) 0.005 % ophthalmic solution, Place 1 drop into both eyes at bedtime. , Disp: , Rfl: 5 .  metoprolol succinate (TOPROL-XL) 25 MG 24 hr tablet, TAKE ONE TABLET BY MOUTH ONCE DAILY, Disp: , Rfl:   Physical exam:  Vitals:   08/19/18 0936  BP: 123/80  Pulse: 98  Temp: 98.7 F (37.1 C)  TempSrc: Tympanic  Weight: 189 lb 11.2 oz (86 kg)   Physical Exam HENT:     Head: Normocephalic and atraumatic.  Eyes:     Pupils: Pupils are equal, round, and reactive to light.  Neck:     Musculoskeletal: Normal range of motion.  Cardiovascular:  Rate and Rhythm: Normal rate and regular rhythm.     Heart sounds: Normal heart sounds.  Pulmonary:     Effort: Pulmonary effort is normal.     Breath sounds: Normal breath sounds.  Abdominal:     General: Bowel sounds are normal.     Palpations: Abdomen is soft.  Skin:    General: Skin is warm and dry.  Neurological:     Mental Status: She is alert and oriented to person, place, and time.    Breast exam was performed in seated and lying down position. Patient is status post right lumpectomy with a well-healed surgical scar. No evidence of any palpable masses. No evidence of axillary adenopathy. No evidence of any palpable masses or lumps in the left breast. No evidence of leftt axillary adenopathy   CMP Latest Ref Rng & Units 04/26/2018  Glucose 70 - 99 mg/dL 210(H)  BUN 8 - 23 mg/dL 28(H)  Creatinine 0.44 - 1.00 mg/dL 1.09(H)  Sodium 135 - 145 mmol/L 138  Potassium 3.5 - 5.1 mmol/L 4.0  Chloride 98 - 111 mmol/L 101  CO2 22 - 32 mmol/L 27  Calcium 8.9 - 10.3 mg/dL 9.3  Total Protein 6.5 - 8.1 g/dL 7.6  Total Bilirubin 0.3 - 1.2 mg/dL 0.7  Alkaline Phos 38 - 126 U/L 60  AST 15 - 41  U/L 22  ALT 0 - 44 U/L 15   CBC Latest Ref Rng & Units 02/23/2018  WBC 4.0 - 10.5 K/uL 7.3  Hemoglobin 12.0 - 15.0 g/dL 13.0  Hematocrit 36.0 - 46.0 % 41.5  Platelets 150 - 400 K/uL 281      Assessment and plan- Patient is a 78 y.o. female with right breast DCIS ER positive currently on Arimidex  Clinically patient is doing well and there is no evidence of recurrence on today's exam.  Mammograms will be coordinated by Dr. Deniece Ree office and she will be due for one in October 2020.  I will see her back in 6 months no labs   Visit Diagnosis 1. Visit for monitoring Arimidex therapy   2. Ductal carcinoma in situ (DCIS) of right breast      Dr. Randa Evens, MD, MPH [facility] at [facility] [phone] 08/21/2018 6:29 PM

## 2018-09-14 ENCOUNTER — Other Ambulatory Visit: Payer: Self-pay

## 2018-09-14 ENCOUNTER — Ambulatory Visit
Admission: RE | Admit: 2018-09-14 | Discharge: 2018-09-14 | Disposition: A | Discharge status: Home / Self Care | Payer: Medicare HMO | Source: Ambulatory Visit | Attending: Radiation Oncology | Admitting: Radiation Oncology

## 2018-09-14 ENCOUNTER — Encounter: Payer: Self-pay | Admitting: Radiation Oncology

## 2018-09-14 DIAGNOSIS — Z923 Personal history of irradiation: Secondary | ICD-10-CM | POA: Diagnosis not present

## 2018-09-14 DIAGNOSIS — Z79811 Long term (current) use of aromatase inhibitors: Secondary | ICD-10-CM | POA: Diagnosis not present

## 2018-09-14 DIAGNOSIS — Z170 Estrogen receptor positive status [ER+]: Secondary | ICD-10-CM | POA: Diagnosis not present

## 2018-09-14 DIAGNOSIS — D0511 Intraductal carcinoma in situ of right breast: Secondary | ICD-10-CM | POA: Diagnosis not present

## 2018-09-14 NOTE — Progress Notes (Signed)
Radiation Oncology Follow up Note  Name: Susan Montgomery   Date:   09/14/2018 MRN:  [id number] DOB: 1940/05/04    This 78 y.o. female presents to the clinic today for 33-month follow-up status post whole breast radiation to her right breast for ER PR positive ductal carcinoma in situ.  REFERRING PROVIDER: Maryland Pink, MD  HPI: Patient is a 78 year old female now seen at 6 months having completed whole breast radiation to her right breast for ER PR positive ductal carcinoma in situ.  Seen today in routine follow-up she is doing well.  She specifically denies breast tenderness cough or bone pain..  She is started on arimadex tolerating that well without side effect.  She has not yet had a follow-up mammogram.  COMPLICATIONS OF TREATMENT: none  FOLLOW UP COMPLIANCE: keeps appointments   PHYSICAL EXAM:  There were no vitals taken for this visit. Lungs are clear to A&P cardiac examination essentially unremarkable with regular rate and rhythm. No dominant mass or nodularity is noted in either breast in 2 positions examined. Incision is well-healed. No axillary or supraclavicular adenopathy is appreciated. Cosmetic result is excellent.  Well-developed well-nourished patient in NAD. HEENT reveals PERLA, EOMI, discs not visualized.  Oral cavity is clear. No oral mucosal lesions are identified. Neck is clear without evidence of cervical or supraclavicular adenopathy. Lungs are clear to A&P. Cardiac examination is essentially unremarkable with regular rate and rhythm without murmur rub or thrill. Abdomen is benign with no organomegaly or masses noted. Motor sensory and DTR levels are equal and symmetric in the upper and lower extremities. Cranial nerves II through XII are grossly intact. Proprioception is intact. No peripheral adenopathy or edema is identified. No motor or sensory levels are noted. Crude visual fields are within normal range.  RADIOLOGY RESULTS: No current films to review  PLAN: Present  time patient is doing well 6 months out from whole breast radiation I am pleased with her overall progress.  I will leave to medical oncology to order her follow-up mammograms.  I have asked to see her back in 6 months for follow-up.  She continues on arimadex without side effect.  Patient knows to call with any concerns.  I would like to take this opportunity to thank you for allowing me to participate in the care of your patient.Noreene Filbert, MD

## 2018-10-12 ENCOUNTER — Telehealth: Payer: Self-pay | Admitting: *Deleted

## 2018-10-12 NOTE — Telephone Encounter (Signed)
Patient called reporting that she has been exposed to Susan Montgomery and wants to speak with Dr Janese Banks about her cancer medicine related to this exposure. [phone]

## 2018-10-12 NOTE — Telephone Encounter (Signed)
I called pt and got her voice mail and said that Dr. Janese Banks would like more info as to the exposure of the covid. Is it someone you live with, a person at work and how much contact you had with that person or random thing. Dr. Janese Banks is ok with her continuing the arimidex and it pt felt ok and if not she could come off of it for 3 weeks but it is up to pt. But we would like a call back about how much exposure she had to covid person

## 2018-10-13 DIAGNOSIS — Z20828 Contact with and (suspected) exposure to other viral communicable diseases: Secondary | ICD-10-CM | POA: Diagnosis not present

## 2018-10-13 NOTE — Telephone Encounter (Signed)
Please disregard the statement below that I put in -it is incorrect. For this pt.

## 2018-10-13 NOTE — Telephone Encounter (Signed)
I have cancelled scans and if he wants to see scans we will have to put the pt in neg. Pressure room

## 2018-10-13 NOTE — Telephone Encounter (Signed)
I called to the home phone today to see if I could find out more info about how much exposure the pt. Came into contact with the covid person. No answer and no voicemail on the home #

## 2018-10-28 DIAGNOSIS — D2262 Melanocytic nevi of left upper limb, including shoulder: Secondary | ICD-10-CM | POA: Diagnosis not present

## 2018-10-28 DIAGNOSIS — Z08 Encounter for follow-up examination after completed treatment for malignant neoplasm: Secondary | ICD-10-CM | POA: Diagnosis not present

## 2018-10-28 DIAGNOSIS — L821 Other seborrheic keratosis: Secondary | ICD-10-CM | POA: Diagnosis not present

## 2018-10-28 DIAGNOSIS — D2261 Melanocytic nevi of right upper limb, including shoulder: Secondary | ICD-10-CM | POA: Diagnosis not present

## 2018-10-28 DIAGNOSIS — D2271 Melanocytic nevi of right lower limb, including hip: Secondary | ICD-10-CM | POA: Diagnosis not present

## 2018-10-28 DIAGNOSIS — Z85828 Personal history of other malignant neoplasm of skin: Secondary | ICD-10-CM | POA: Diagnosis not present

## 2018-10-28 DIAGNOSIS — D2272 Melanocytic nevi of left lower limb, including hip: Secondary | ICD-10-CM | POA: Diagnosis not present

## 2018-10-28 DIAGNOSIS — D225 Melanocytic nevi of trunk: Secondary | ICD-10-CM | POA: Diagnosis not present

## 2018-10-28 DIAGNOSIS — L570 Actinic keratosis: Secondary | ICD-10-CM | POA: Diagnosis not present

## 2018-11-07 NOTE — Telephone Encounter (Signed)
I have reviewed her chart in care everywhere- she went to see her PCP and she was possibly exposed to her granddaughter that tested positive the Sat. Before this date. Pt had no sx. , got covid test and it was neg. And she was told to quarantine until test results and to call if she had sx.

## 2018-11-24 DIAGNOSIS — E119 Type 2 diabetes mellitus without complications: Secondary | ICD-10-CM | POA: Diagnosis not present

## 2018-11-24 DIAGNOSIS — I10 Essential (primary) hypertension: Secondary | ICD-10-CM | POA: Diagnosis not present

## 2018-11-25 ENCOUNTER — Other Ambulatory Visit: Payer: Self-pay | Admitting: General Surgery

## 2018-11-25 DIAGNOSIS — Z853 Personal history of malignant neoplasm of breast: Secondary | ICD-10-CM

## 2018-11-30 DIAGNOSIS — N3281 Overactive bladder: Secondary | ICD-10-CM | POA: Diagnosis not present

## 2018-11-30 DIAGNOSIS — E119 Type 2 diabetes mellitus without complications: Secondary | ICD-10-CM | POA: Diagnosis not present

## 2018-11-30 DIAGNOSIS — Z0000 Encounter for general adult medical examination without abnormal findings: Secondary | ICD-10-CM | POA: Diagnosis not present

## 2018-11-30 DIAGNOSIS — I10 Essential (primary) hypertension: Secondary | ICD-10-CM | POA: Diagnosis not present

## 2018-11-30 DIAGNOSIS — Z79899 Other long term (current) drug therapy: Secondary | ICD-10-CM | POA: Diagnosis not present

## 2018-11-30 DIAGNOSIS — E785 Hyperlipidemia, unspecified: Secondary | ICD-10-CM | POA: Diagnosis not present

## 2018-11-30 DIAGNOSIS — Z23 Encounter for immunization: Secondary | ICD-10-CM | POA: Diagnosis not present

## 2018-12-27 ENCOUNTER — Ambulatory Visit
Admission: RE | Admit: 2018-12-27 | Discharge: 2018-12-27 | Disposition: A | Discharge status: Home / Self Care | Payer: Medicare HMO | Source: Ambulatory Visit | Attending: General Surgery | Admitting: General Surgery

## 2018-12-27 DIAGNOSIS — Z853 Personal history of malignant neoplasm of breast: Secondary | ICD-10-CM | POA: Insufficient documentation

## 2018-12-27 DIAGNOSIS — R922 Inconclusive mammogram: Secondary | ICD-10-CM | POA: Diagnosis not present

## 2019-01-03 DIAGNOSIS — D0511 Intraductal carcinoma in situ of right breast: Secondary | ICD-10-CM | POA: Diagnosis not present

## 2019-02-06 DIAGNOSIS — H401131 Primary open-angle glaucoma, bilateral, mild stage: Secondary | ICD-10-CM | POA: Diagnosis not present

## 2019-02-09 ENCOUNTER — Other Ambulatory Visit: Payer: Self-pay | Admitting: Oncology

## 2019-02-17 ENCOUNTER — Inpatient Hospital Stay: Payer: Medicare HMO | Admitting: Oncology

## 2019-02-23 DIAGNOSIS — E785 Hyperlipidemia, unspecified: Secondary | ICD-10-CM | POA: Diagnosis not present

## 2019-02-23 DIAGNOSIS — E119 Type 2 diabetes mellitus without complications: Secondary | ICD-10-CM | POA: Diagnosis not present

## 2019-03-01 DIAGNOSIS — E1165 Type 2 diabetes mellitus with hyperglycemia: Secondary | ICD-10-CM | POA: Diagnosis not present

## 2019-03-22 ENCOUNTER — Encounter: Payer: Self-pay | Admitting: Radiation Oncology

## 2019-03-22 ENCOUNTER — Ambulatory Visit
Admission: RE | Admit: 2019-03-22 | Discharge: 2019-03-22 | Disposition: A | Discharge status: Home / Self Care | Payer: Medicare HMO | Source: Ambulatory Visit | Attending: Radiation Oncology | Admitting: Radiation Oncology

## 2019-03-22 ENCOUNTER — Other Ambulatory Visit: Payer: Self-pay

## 2019-03-22 VITALS — BP 140/73 | HR 89 | Resp 18 | Wt 183.8 lb

## 2019-03-22 DIAGNOSIS — Z79811 Long term (current) use of aromatase inhibitors: Secondary | ICD-10-CM | POA: Insufficient documentation

## 2019-03-22 DIAGNOSIS — Z170 Estrogen receptor positive status [ER+]: Secondary | ICD-10-CM | POA: Diagnosis not present

## 2019-03-22 DIAGNOSIS — Z923 Personal history of irradiation: Secondary | ICD-10-CM | POA: Diagnosis not present

## 2019-03-22 DIAGNOSIS — D0511 Intraductal carcinoma in situ of right breast: Secondary | ICD-10-CM | POA: Diagnosis not present

## 2019-03-22 NOTE — Progress Notes (Signed)
Radiation Oncology Follow up Note  Name: Susan Montgomery   Date:   03/22/2019 MRN:  [id number] DOB: 1940/08/01    This 79 y.o. female presents to the clinic today for 1 year follow-up status post whole breast radiation to her right breast for ER/PR positive ductal carcinoma in situ.  REFERRING PROVIDER: Maryland Pink, MD  HPI: Patient is a 79 year old female now out 1 year having completed whole breast radiation to her right breast for ER/PR positive ductal carcinoma in situ.  Seen today in routine follow-up she is doing well.  She specifically denies breast tenderness cough or bone pain..  She had mammograms back in October which I have reviewed were BI-RADS 2 benign.  She is currently on Arimidex tolerating that well without side effect.  COMPLICATIONS OF TREATMENT: none  FOLLOW UP COMPLIANCE: keeps appointments   PHYSICAL EXAM:  BP 140/73 (BP Location: Left Arm, Patient Position: Sitting)   Pulse 89   Resp 18   Wt 183 lb 12.8 oz (83.4 kg)   BMI 31.55 kg/m  Lungs are clear to A&P cardiac examination essentially unremarkable with regular rate and rhythm. No dominant mass or nodularity is noted in either breast in 2 positions examined. Incision is well-healed. No axillary or supraclavicular adenopathy is appreciated. Cosmetic result is excellent.  Well-developed well-nourished patient in NAD. HEENT reveals PERLA, EOMI, discs not visualized.  Oral cavity is clear. No oral mucosal lesions are identified. Neck is clear without evidence of cervical or supraclavicular adenopathy. Lungs are clear to A&P. Cardiac examination is essentially unremarkable with regular rate and rhythm without murmur rub or thrill. Abdomen is benign with no organomegaly or masses noted. Motor sensory and DTR levels are equal and symmetric in the upper and lower extremities. Cranial nerves II through XII are grossly intact. Proprioception is intact. No peripheral adenopathy or edema is identified. No motor or sensory  levels are noted. Crude visual fields are within normal range.  RADIOLOGY RESULTS: Mammograms reviewed compatible with above-stated findings  PLAN: Present time patient is doing well 1 year out with no evidence of disease.  I am pleased with her overall progress.  I have asked to see her back in 1 year for follow-up.  She continues on Arimidex without side effect.  Patient knows to call with any concerns at any time.  I would like to take this opportunity to thank you for allowing me to participate in the care of your patient.Noreene Filbert, MD

## 2019-03-24 ENCOUNTER — Encounter: Payer: Self-pay | Admitting: Oncology

## 2019-03-24 ENCOUNTER — Other Ambulatory Visit: Payer: Self-pay

## 2019-03-24 ENCOUNTER — Inpatient Hospital Stay: Payer: Medicare HMO | Attending: Oncology | Admitting: Oncology

## 2019-03-24 VITALS — BP 132/71 | HR 96 | Temp 98.1°F | Wt 183.9 lb

## 2019-03-24 DIAGNOSIS — Z79811 Long term (current) use of aromatase inhibitors: Secondary | ICD-10-CM | POA: Diagnosis not present

## 2019-03-24 DIAGNOSIS — Z86000 Personal history of in-situ neoplasm of breast: Secondary | ICD-10-CM | POA: Diagnosis not present

## 2019-03-24 DIAGNOSIS — Z170 Estrogen receptor positive status [ER+]: Secondary | ICD-10-CM | POA: Insufficient documentation

## 2019-03-24 DIAGNOSIS — Z08 Encounter for follow-up examination after completed treatment for malignant neoplasm: Secondary | ICD-10-CM

## 2019-03-24 DIAGNOSIS — Z5181 Encounter for therapeutic drug level monitoring: Secondary | ICD-10-CM

## 2019-03-24 DIAGNOSIS — D0511 Intraductal carcinoma in situ of right breast: Secondary | ICD-10-CM | POA: Diagnosis not present

## 2019-03-24 NOTE — Progress Notes (Signed)
Patient stated that she had been doing well with no complaints. 

## 2019-03-27 NOTE — Progress Notes (Signed)
Hematology/Oncology Consult note [facility]  Telephone:([phone] [phone]  Patient Care Team: [place] Pink, MD as PCP - General (Family Medicine)   Name of the patient: Susan Montgomery  [id number]  [birth year]   Date of visit: 03/27/19  Diagnosis- right breast DCIS ER positive  Chief complaint/ Reason for visit-routine follow-up of breast cancer  Heme/Onc history: patient is a 79 year old female with a past medical history significant for osteopenia hypertension who recently underwent bilateral screening mammogram on 11/10/2017 which showed some suspicious calcifications in the right breast. Diagnostic mammogram revealed faint linear calcifications measuring 2.2 x 0.4 x 0.6 cm. No associated mass or distortion. This was followed by a core biopsy which revealed high-grade DCIS with comedonecrosis. Patient has already met with Dr. Peyton Najjar and surgery is planned on 12/29/2017. Patient previously to birth control Premarin for 10 years her paternal great aunt had history of breast cancer in her 74s. No other family history of breast ovarian pancreatic or colon cancer. She has not had any prior abnormal mammograms or breast biopsies. She otherwise feels well today and denies other complaints. She attained menopause at the age of 46. Age of first. At 27 and first childbirth at the age of 63. She is G3, P3 L3.  Final pathology showed high-grade DCIS with calcifications.Distance from closest anterior margin was less than 0.5 mm focally. Extent of DCIS was at least 20 mm, grade 3.  She completed adjuvant radiation treatment and started Arimidex in December 2019.  Interval history-patient is tolerating Arimidex well without any significant side effects.  Denies any significant fatigue joint pain or hot flashes.  ECOG PS- 1 Pain scale- 0 Opioid associated constipation- no  Review of systems- Review of Systems  Constitutional: Negative for chills,  fever, malaise/fatigue and weight loss.  HENT: Negative for congestion, ear discharge and nosebleeds.   Eyes: Negative for blurred vision.  Respiratory: Negative for cough, hemoptysis, sputum production, shortness of breath and wheezing.   Cardiovascular: Negative for chest pain, palpitations, orthopnea and claudication.  Gastrointestinal: Negative for abdominal pain, blood in stool, constipation, diarrhea, heartburn, melena, nausea and vomiting.  Genitourinary: Negative for dysuria, flank pain, frequency, hematuria and urgency.  Musculoskeletal: Negative for back pain, joint pain and myalgias.  Skin: Negative for rash.  Neurological: Negative for dizziness, tingling, focal weakness, seizures, weakness and headaches.  Endo/Heme/Allergies: Does not bruise/bleed easily.  Psychiatric/Behavioral: Negative for depression and suicidal ideas. The patient does not have insomnia.       Allergies  Allergen Reactions  . Codeine Anaphylaxis  . Penicillins Anaphylaxis  . Sulfa Antibiotics Swelling     Past Medical History:  Diagnosis Date  . Anxiety   . Breast cancer ([facility]) 12/2017  . Cancer ([facility])    skin  . Dyspnea    With walking fast or activity around the house  . GERD (gastroesophageal reflux disease)   . History of kidney stones   . Hypertension   . Personal history of radiation therapy      Past Surgical History:  Procedure Laterality Date  . ABDOMINAL HYSTERECTOMY    . ACHILLES TENDON REPAIR Right   . BREAST BIOPSY Right 12/09/2017   -"coil"  clip High-grade comedo type DCIS  . BREAST EXCISIONAL BIOPSY Right 12/29/2017   lumpectomy  . BREAST LUMPECTOMY Right 12/29/2017  . COLONOSCOPY WITH PROPOFOL    . EYE SURGERY     Cataract removal  . PARTIAL MASTECTOMY WITH NEEDLE LOCALIZATION Right 12/29/2017   Procedure: PARTIAL  MASTECTOMY WITH NEEDLE LOCALIZATION;  Surgeon: Herbert Pun, MD;  Location: [facility] ORS;  Service: General;  Laterality: Right;  . SKIN CANCER  EXCISION     Basil Cell on the nose    Social History   Socioeconomic History  . Marital status: Married    Spouse name: Not on file  . Number of children: Not on file  . Years of education: Not on file  . Highest education level: Not on file  Occupational History  . Not on file  Tobacco Use  . Smoking status: Never Smoker  . Smokeless tobacco: Never Used  Substance and Sexual Activity  . Alcohol use: Never  . Drug use: Never  . Sexual activity: Not Currently  Other Topics Concern  . Not on file  Social History Narrative  . Not on file   Social Determinants of Health   Financial Resource Strain:   . Difficulty of Paying Living Expenses: Not on file  Food Insecurity:   . Worried About Charity fundraiser in the Last Year: Not on file  . Ran Out of Food in the Last Year: Not on file  Transportation Needs:   . Lack of Transportation (Medical): Not on file  . Lack of Transportation (Non-Medical): Not on file  Physical Activity:   . Days of Exercise per Week: Not on file  . Minutes of Exercise per Session: Not on file  Stress:   . Feeling of Stress : Not on file  Social Connections:   . Frequency of Communication with Friends and Family: Not on file  . Frequency of Social Gatherings with Friends and Family: Not on file  . Attends Religious Services: Not on file  . Active Member of Clubs or Organizations: Not on file  . Attends Archivist Meetings: Not on file  . Marital Status: Not on file  Intimate Partner Violence:   . Fear of Current or Ex-Partner: Not on file  . Emotionally Abused: Not on file  . Physically Abused: Not on file  . Sexually Abused: Not on file    Family History  Problem Relation Age of Onset  . Breast cancer Paternal Aunt 73     Current Outpatient Medications:  .  acetaminophen (TYLENOL) 325 MG tablet, Take 650 mg by mouth every 6 (six) hours as needed., Disp: , Rfl:  .  anastrozole (ARIMIDEX) 1 MG tablet, Take 1 tablet by mouth  once daily, Disp: 90 tablet, Rfl: 0 .  Calcium Carb-Cholecalciferol (CALCIUM-VITAMIN D) 500-400 MG-UNIT TABS, Take 1 tablet by mouth daily., Disp: , Rfl:  .  citalopram (CELEXA) 20 MG tablet, TAKE 1 TABLET BY MOUTH ONCE DAILY, Disp: , Rfl:  .  latanoprost (XALATAN) 0.005 % ophthalmic solution, Place 1 drop into both eyes at bedtime. , Disp: , Rfl: 5 .  omeprazole (PRILOSEC) 20 MG capsule, Take 20 mg by mouth daily., Disp: , Rfl:  .  simvastatin (ZOCOR) 10 MG tablet, Take by mouth., Disp: , Rfl:  .  TRADJENTA 5 MG TABS tablet, , Disp: , Rfl:  .  triamterene-hydrochlorothiazide (MAXZIDE) 75-50 MG tablet, Take 1 tablet by mouth daily., Disp: , Rfl: 3 .  mirabegron ER (MYRBETRIQ) 25 MG TB24 tablet, Take by mouth., Disp: , Rfl:   Physical exam:  Vitals:   03/24/19 1130  BP: 132/71  Pulse: 96  Temp: 98.1 F (36.7 C)  TempSrc: Tympanic  Weight: 183 lb 14.4 oz (83.4 kg)   Physical Exam HENT:     Head: Normocephalic  and atraumatic.  Eyes:     Pupils: Pupils are equal, round, and reactive to light.  Cardiovascular:     Rate and Rhythm: Normal rate and regular rhythm.     Heart sounds: Normal heart sounds.  Pulmonary:     Effort: Pulmonary effort is normal.     Breath sounds: Normal breath sounds.  Abdominal:     General: Bowel sounds are normal.     Palpations: Abdomen is soft.  Musculoskeletal:     Cervical back: Normal range of motion.  Skin:    General: Skin is warm and dry.  Neurological:     Mental Status: She is alert and oriented to person, place, and time.    Breast exam was performed in seated and lying down position. Patient is status post right lumpectomy with a well-healed surgical scar. No evidence of any palpable masses. No evidence of axillary adenopathy. No evidence of any palpable masses or lumps in the left breast. No evidence of leftt axillary adenopathy   CMP Latest Ref Rng & Units 04/26/2018  Glucose 70 - 99 mg/dL 210(H)  BUN 8 - 23 mg/dL 28(H)  Creatinine  0.44 - 1.00 mg/dL 1.09(H)  Sodium 135 - 145 mmol/L 138  Potassium 3.5 - 5.1 mmol/L 4.0  Chloride 98 - 111 mmol/L 101  CO2 22 - 32 mmol/L 27  Calcium 8.9 - 10.3 mg/dL 9.3  Total Protein 6.5 - 8.1 g/dL 7.6  Total Bilirubin 0.3 - 1.2 mg/dL 0.7  Alkaline Phos 38 - 126 U/L 60  AST 15 - 41 U/L 22  ALT 0 - 44 U/L 15   CBC Latest Ref Rng & Units 02/23/2018  WBC 4.0 - 10.5 K/uL 7.3  Hemoglobin 12.0 - 15.0 g/dL 13.0  Hematocrit 36.0 - 46.0 % 41.5  Platelets 150 - 400 K/uL 281      Assessment and plan- Patient is a 79 y.o. female with right breast DCIS ER positive s/p lumpectomy and adjuvant radiation treatment.  She is currently on Arimidex and this is a routine follow-up visit  Clinically patient is doing well and no concerning signs and symptoms of recurrence based on today's exam.  Her mammogram from October 2020 was also unremarkable.  She is tolerating Arimidex well and will continue that for 4 more years.  She will be due for a bone density scan in December of this year.  I also encouraged her to take her Covid vaccine.  I will see her back in 6 months   Visit Diagnosis 1. Visit for monitoring Arimidex therapy   2. Encounter for follow-up surveillance of ductal carcinoma in situ (DCIS) of breast      Dr. Randa Evens, MD, MPH [facility] [phone] 03/27/2019 1:31 PM

## 2019-05-24 DIAGNOSIS — E1165 Type 2 diabetes mellitus with hyperglycemia: Secondary | ICD-10-CM | POA: Diagnosis not present

## 2019-05-31 DIAGNOSIS — E1165 Type 2 diabetes mellitus with hyperglycemia: Secondary | ICD-10-CM | POA: Diagnosis not present

## 2019-06-22 ENCOUNTER — Encounter: Payer: Self-pay | Admitting: *Deleted

## 2019-06-22 ENCOUNTER — Other Ambulatory Visit: Payer: Self-pay

## 2019-06-22 ENCOUNTER — Encounter: Payer: Medicare HMO | Attending: Family Medicine | Admitting: *Deleted

## 2019-06-22 VITALS — BP 122/70 | Ht 64.0 in | Wt 179.1 lb

## 2019-06-22 DIAGNOSIS — Z713 Dietary counseling and surveillance: Secondary | ICD-10-CM | POA: Diagnosis not present

## 2019-06-22 DIAGNOSIS — E1165 Type 2 diabetes mellitus with hyperglycemia: Secondary | ICD-10-CM

## 2019-06-22 DIAGNOSIS — E119 Type 2 diabetes mellitus without complications: Secondary | ICD-10-CM | POA: Insufficient documentation

## 2019-06-22 NOTE — Progress Notes (Signed)
Diabetes Self-Management Education  Visit Type: First/Initial  Appt. Start Time: 1105 Appt. End Time: [id number]  06/22/2019  [name], identified by name and date of birth, is a 79 y.o. female with a diagnosis of Diabetes: Type 2.   ASSESSMENT  Blood pressure 122/70, height 5\' 4"  (1.626 m), weight 179 lb 1.6 oz (81.2 kg). Body mass index is 30.74 kg/m.  Diabetes Self-Management Education - 06/22/19 1250      Visit Information   Visit Type  First/Initial      Initial Visit   Diabetes Type  Type 2    Are you currently following a meal plan?  Yes    What type of meal plan do you follow?  "decrease white foods"    Are you taking your medications as prescribed?  Yes    Date Diagnosed  Pt reports "3 months"  A1C's since 2015 shows 6.5 % and greater      Health Coping   How would you rate your overall health?  Good      Psychosocial Assessment   Patient Belief/Attitude about Diabetes  Other (comment)   Pt reports feeling "nervous"   Self-care barriers  Hard of hearing    Self-management support  Doctor's office;Family    Patient Concerns  Nutrition/Meal planning;Glycemic Control;Healthy Lifestyle    Special Needs  None    Preferred Learning Style  Visual    Learning Readiness  Change in progress    How often do you need to have someone help you when you read instructions, pamphlets, or other written materials from your doctor or pharmacy?  1 - Never    What is the last grade level you completed in school?  12th      Pre-Education Assessment   Patient understands the diabetes disease and treatment process.  Needs Instruction    Patient understands incorporating nutritional management into lifestyle.  Needs Instruction    Patient undertands incorporating physical activity into lifestyle.  Needs Instruction    Patient understands using medications safely.  Needs Instruction    Patient understands monitoring blood glucose, interpreting and using results  Needs Review    Patient understands prevention, detection, and treatment of acute complications.  Needs Instruction    Patient understands prevention, detection, and treatment of chronic complications.  Needs Review    Patient understands how to develop strategies to address psychosocial issues.  Needs Instruction    Patient understands how to develop strategies to promote health/change behavior.  Needs Instruction      Complications   Last HgB A1C per patient/outside source  7.5 %   05/24/2019   How often do you check your blood sugar?  1-2 times/day    Fasting Blood glucose range (mg/dL)  70-129;130-179   FBG's 129-162 mg/dL   Postprandial Blood glucose range (mg/dL)  70-129;130-179;180-200;>200   pp's 82-266 mg/dL; bedtime readings 126-209 mg/dL   Number of hypoglycemic episodes per month  --   Pt reports she started to feel a little shaky when she saw the reading of 82 mg/dL yesterday.   Have you had a dilated eye exam in the past 12 months?  Yes    Have you had a dental exam in the past 12 months?  No    Are you checking your feet?  Yes    How many days per week are you checking your feet?  2      Dietary Intake   Breakfast  toast or turnover    Lunch  sandwich - ham, pimento cheese, chicken salad, fruit    Snack (afternoon)  0-2 snacks/day - fruit (cantaloupe, watermelon, apples, pears, grapes, banana), energy bars    Dinner  stew beef, chicken, pork, spaghetti, corn, peas, pinto beans, broccoli, cauliffower, carrots, lettuce, tomatoes, cuccumbers, celery, squash, okra, greens    Beverage(s)  water, unsweetened tea, diet soda      Exercise   Exercise Type  ADL's      Patient Education   Previous Diabetes Education  No    Disease state   Definition of diabetes, type 1 and 2, and the diagnosis of diabetes;Factors that contribute to the development of diabetes    Nutrition management   Role of diet in the treatment of diabetes and the relationship between the three main macronutrients and blood  glucose level;Food label reading, portion sizes and measuring food.;Reviewed blood glucose goals for pre and post meals and how to evaluate the patients' food intake on their blood glucose level.;Meal timing in regards to the patients' current diabetes medication.    Physical activity and exercise   Role of exercise on diabetes management, blood pressure control and cardiac health.    Medications  Reviewed patients medication for diabetes, action, purpose, timing of dose and side effects.    Monitoring  Purpose and frequency of SMBG.;Taught/discussed recording of test results and interpretation of SMBG.;Identified appropriate SMBG and/or A1C goals.    Acute complications  Taught treatment of hypoglycemia - the 15 rule.    Chronic complications  Relationship between chronic complications and blood glucose control    Psychosocial adjustment  Identified and addressed patients feelings and concerns about diabetes      Individualized Goals (developed by patient)   Reducing Risk  Other (comment)   improve blood sugars, lead a healthier lifestyle     Outcomes   Expected Outcomes  Demonstrated interest in learning. Expect positive outcomes       Individualized Plan for Diabetes Self-Management Training:   Learning Objective:  Patient will have a greater understanding of diabetes self-management. Patient education plan is to attend individual and/or group sessions per assessed needs and concerns.   Plan:   Patient Instructions  Check blood sugars 2 x day before breakfast and 2 hrs after one meal every day Bring blood sugar records to the next appointment Exercise: Start walking for 10 minutes 3 days a week and gradually increase Eat 3 meals day, 1-2  snacks a day Space meals 4-6 hours apart Allow 2-3 hours between meals and snacks Don't skip meals Include 1 serving protein with meals and snacks that include fruit Complete 3 Day Food Record and bring to next appt Carry fast acting glucose and  a snack at all times Return for appointment on:  Tuesday Jul 18, 2019 at 11:00 am with Freda Munro (nurse)  Expected Outcomes:  Demonstrated interest in learning. Expect positive outcomes  Education material provided:  General Meal Planning Guidelines Simple Meal Plan 3 Day Food Record Glucose tablets Symptoms, causes and treatments of Hypoglycemia  If problems or questions, patient to contact team via:  Susan Drilling, RN, [place], [place] [phone]  Future DSME appointment:  Jul 18, 2019 with this nurse

## 2019-06-22 NOTE — Patient Instructions (Addendum)
Check blood sugars 2 x day before breakfast and 2 hrs after one meal every day Bring blood sugar records to the next appointment  Exercise: Start walking for 10 minutes 3 days a week and gradually increase  Eat 3 meals day, 1-2  snacks a day Space meals 4-6 hours apart Allow 2-3 hours between meals and snacks Don't skip meals Include 1 serving protein with meals and snacks that include fruit  Complete 3 Day Food Record and bring to next appt  Carry fast acting glucose and a snack at all times  Return for appointment on:  Tuesday Jul 18, 2019 at 11:00 am with Freda Munro (nurse)

## 2019-07-18 ENCOUNTER — Encounter: Payer: Medicare HMO | Attending: Family Medicine | Admitting: *Deleted

## 2019-07-18 ENCOUNTER — Encounter: Payer: Self-pay | Admitting: *Deleted

## 2019-07-18 ENCOUNTER — Other Ambulatory Visit: Payer: Self-pay

## 2019-07-18 VITALS — BP 116/70 | Wt 178.9 lb

## 2019-07-18 DIAGNOSIS — Z713 Dietary counseling and surveillance: Secondary | ICD-10-CM | POA: Diagnosis not present

## 2019-07-18 DIAGNOSIS — E1165 Type 2 diabetes mellitus with hyperglycemia: Secondary | ICD-10-CM

## 2019-07-18 DIAGNOSIS — E119 Type 2 diabetes mellitus without complications: Secondary | ICD-10-CM | POA: Diagnosis not present

## 2019-07-18 NOTE — Progress Notes (Signed)
Diabetes Self-Management Education  Visit Type: Follow-up Appt. Start Time: 1100 Appt. End Time: [id number]  07/18/2019  Susan Montgomery, identified by name and date of birth, is a 79 y.o. female with a diagnosis of Diabetes: Type 2.   ASSESSMENT  Blood pressure 116/70, weight 178 lb 14.4 oz (81.1 kg). Body mass index is 30.71 kg/m.  Diabetes Self-Management Education - 07/18/19 1322      Visit Information   Visit Type  Follow-up      Initial Visit   Diabetes Type  Type 2      Complications   How often do you check your blood sugar?  1-2 times/day    Fasting Blood glucose range (mg/dL)  130-179   FBG's 138-158 mg/dL   Postprandial Blood glucose range (mg/dL)  70-129;130-179;>200   pp's 100-172 mg/dL with 3 readings of 206, 237 and 200 mg/dL related to food choices.   Number of hypoglycemic episodes per month  0   Pt has restarted Tradjenta and stopped Glipizide per MD.   Have you had a dilated eye exam in the past 12 months?  Yes    Have you had a dental exam in the past 12 months?  No    Are you checking your feet?  Yes    How many days per week are you checking your feet?  7      Dietary Intake   Breakfast  2 meals/day and 0-1 snack (didn't bring 3 Day Food Record)   Snack (morning)  reports she has stopped eating candy    Beverage(s)  reports now drinking tea with no sugar      Exercise   Exercise Type  ADL's      Patient Education   Disease state   Explored patient's options for treatment of their diabetes    Nutrition management   Food label reading, portion sizes and measuring food.;Carbohydrate counting;Reviewed blood glucose goals for pre and post meals and how to evaluate the patients' food intake on their blood glucose level.;Meal timing in regards to the patients' current diabetes medication.    Physical activity and exercise   Role of exercise on diabetes management, blood pressure control and cardiac health.    Medications  Reviewed patients medication for  diabetes, action, purpose, timing of dose and side effects.    Monitoring  Purpose and frequency of SMBG.;Taught/discussed recording of test results and interpretation of SMBG.;Identified appropriate SMBG and/or A1C goals.    Acute complications  Taught treatment of hypoglycemia - the 15 rule.    Chronic complications  Relationship between chronic complications and blood glucose control    Psychosocial adjustment  Identified and addressed patients feelings and concerns about diabetes      Individualized Goals (developed by patient)   Nutrition  Follow meal plan discussed    Physical Activity  Exercise 3-5 times per week    Medications  take my medication as prescribed    Monitoring   test blood glucose pre and post meals as discussed    Reducing Risk  examine blood glucose patterns;do foot checks daily      Post-Education Assessment   Patient understands the diabetes disease and treatment process.  Demonstrates understanding / competency    Patient understands incorporating nutritional management into lifestyle.  Needs Review    Patient undertands incorporating physical activity into lifestyle.  Needs Review    Patient understands using medications safely.  Demonstrates understanding / competency    Patient understands monitoring blood glucose, interpreting  and using results  Demonstrates understanding / competency    Patient understands prevention, detection, and treatment of acute complications.  Demonstrates understanding / competency    Patient understands prevention, detection, and treatment of chronic complications.  Demonstrates understanding / competency    Patient understands how to develop strategies to address psychosocial issues.  Demonstrates understanding / competency    Patient understands how to develop strategies to promote health/change behavior.  Demonstrates understanding / competency      Outcomes   Expected Outcomes  Demonstrated interest in learning. Expect positive  outcomes    Program Status  Completed      Subsequent Visit   Since your last visit have you continued or begun to take your medications as prescribed?  Yes    Since your last visit have you had your blood pressure checked?  No    Since your last visit have you experienced any weight changes?  No change    Since your last visit, are you checking your blood glucose at least once a day?  Yes       Individualized Plan for Diabetes Self-Management Training:   Learning Objective:  Patient will have a greater understanding of diabetes self-management. Patient education plan is to attend individual and/or group sessions per assessed needs and concerns.   Plan:   Patient Instructions  Check blood sugars 2 x day before breakfast and 2 hrs after supper every day Bring blood sugar records to the next MD appointment Exercise: Start walking for 10 minutes  3  days a week and increase as tolerated Eat 3 meals day, 1-2  snacks a day Space meals 4-6 hours apart Don't skip meals - eat at least 1 serving of protein and 1 serving of carbohydrate Eat 1 serving of protein when eating fruit as a snack  Expected Outcomes:  Demonstrated interest in learning. Expect positive outcomes  Education material provided:  Planning a Balanced Meal Healthy and Sound Snacking  If problems or questions, patient to contact team via:  Johny Drilling, RN, [place], [place] [phone]  Future DSME appointment: PRN

## 2019-07-18 NOTE — Patient Instructions (Signed)
Check blood sugars 2 x day before breakfast and 2 hrs after supper every day Bring blood sugar records to the next MD appointment  Exercise: Start walking for 10 minutes  3  days a week and increase as tolerated  Eat 3 meals day, 1-2  snacks a day Space meals 4-6 hours apart Don't skip meals - eat at least 1 serving of protein and 1 serving of carbohydrate Eat 1 serving of protein when eating fruit as a snack

## 2019-08-09 DIAGNOSIS — H401131 Primary open-angle glaucoma, bilateral, mild stage: Secondary | ICD-10-CM | POA: Diagnosis not present

## 2019-08-16 DIAGNOSIS — H401131 Primary open-angle glaucoma, bilateral, mild stage: Secondary | ICD-10-CM | POA: Diagnosis not present

## 2019-08-18 ENCOUNTER — Other Ambulatory Visit: Payer: Self-pay | Admitting: Oncology

## 2019-08-19 ENCOUNTER — Other Ambulatory Visit: Payer: Self-pay | Admitting: Oncology

## 2019-08-24 DIAGNOSIS — E1165 Type 2 diabetes mellitus with hyperglycemia: Secondary | ICD-10-CM | POA: Diagnosis not present

## 2019-08-31 DIAGNOSIS — E119 Type 2 diabetes mellitus without complications: Secondary | ICD-10-CM | POA: Diagnosis not present

## 2019-09-22 ENCOUNTER — Other Ambulatory Visit: Payer: Self-pay

## 2019-09-22 ENCOUNTER — Encounter: Payer: Self-pay | Admitting: Oncology

## 2019-09-22 ENCOUNTER — Inpatient Hospital Stay: Payer: Medicare HMO | Attending: Oncology | Admitting: Oncology

## 2019-09-22 ENCOUNTER — Inpatient Hospital Stay: Payer: Medicare HMO | Admitting: Oncology

## 2019-09-22 VITALS — BP 126/65 | HR 81 | Temp 97.1°F | Resp 18 | Ht 64.0 in | Wt 173.3 lb

## 2019-09-22 DIAGNOSIS — D0511 Intraductal carcinoma in situ of right breast: Secondary | ICD-10-CM | POA: Diagnosis not present

## 2019-09-22 DIAGNOSIS — Z5181 Encounter for therapeutic drug level monitoring: Secondary | ICD-10-CM

## 2019-09-22 DIAGNOSIS — Z79811 Long term (current) use of aromatase inhibitors: Secondary | ICD-10-CM

## 2019-09-22 NOTE — Progress Notes (Signed)
No new changes noted today 

## 2019-09-22 NOTE — Progress Notes (Signed)
Hematology/Oncology Consult note [facility]  Telephone:([phone] [phone]  Patient Care Team: Maryland Pink, MD as PCP - General (Family Medicine)   Name of the patient: Susan Montgomery  [id number]  [birth year]   Date of visit: 09/22/19  Diagnosis- right breast DCIS ER positive  Chief complaint/ Reason for visit-routine follow-up of breast cancer  Heme/Onc history: patient is a 79 year old female with a past medical history significant for osteopenia hypertension who recently underwent bilateral screening mammogram on 11/10/2017 which showed some suspicious calcifications in the right breast. Diagnostic mammogram revealed faint linear calcifications measuring 2.2 x 0.4 x 0.6 cm. No associated mass or distortion. This was followed by a core biopsy which revealed high-grade DCIS with comedonecrosis. Patient has already met with Dr. Peyton Najjar and surgery is planned on 12/29/2017. Patient previously to birth control Premarin for 10 years her paternal great aunt had history of breast cancer in her 1s. No other family history of breast ovarian pancreatic or colon cancer. She has not had any prior abnormal mammograms or breast biopsies. She otherwise feels well today and denies other complaints. She attained menopause at the age of 32. Age of first. At 79 and first childbirth at the age of 17. She is G3, P3 L3.  Final pathology showed high-grade DCIS with calcifications.Distance from closest anterior margin was less than 0.5 mm focally. Extent of DCIS was at least 20 mm, grade 3.  She completed adjuvant radiation treatment and started Arimidex in December 2019.   Interval history-patient reports doing well on Arimidex.  She has occasional pain at the lumpectomy site but denies other complaints at this time.  ECOG PS- 1 Pain scale- 0   Review of systems- Review of Systems  Constitutional: Negative for chills, fever, malaise/fatigue and weight  loss.  HENT: Negative for congestion, ear discharge and nosebleeds.   Eyes: Negative for blurred vision.  Respiratory: Negative for cough, hemoptysis, sputum production, shortness of breath and wheezing.   Cardiovascular: Negative for chest pain, palpitations, orthopnea and claudication.  Gastrointestinal: Negative for abdominal pain, blood in stool, constipation, diarrhea, heartburn, melena, nausea and vomiting.  Genitourinary: Negative for dysuria, flank pain, frequency, hematuria and urgency.  Musculoskeletal: Negative for back pain, joint pain and myalgias.  Skin: Negative for rash.  Neurological: Negative for dizziness, tingling, focal weakness, seizures, weakness and headaches.  Endo/Heme/Allergies: Does not bruise/bleed easily.  Psychiatric/Behavioral: Negative for depression and suicidal ideas. The patient does not have insomnia.       Allergies  Allergen Reactions  . Codeine Anaphylaxis  . Penicillins Anaphylaxis  . Sulfa Antibiotics Swelling     Past Medical History:  Diagnosis Date  . Anxiety   . Breast cancer ([place]) 12/2017  . Cancer ([place])    skin  . Diabetes ([place])   . Diabetes mellitus without complication ([place])   . Dyspnea    With walking fast or activity around the house  . GERD (gastroesophageal reflux disease)   . History of kidney stones   . Hypertension   . Personal history of radiation therapy      Past Surgical History:  Procedure Laterality Date  . ABDOMINAL HYSTERECTOMY    . ACHILLES TENDON REPAIR Right   . BREAST BIOPSY Right 12/09/2017   -"coil"  clip High-grade comedo type DCIS  . BREAST EXCISIONAL BIOPSY Right 12/29/2017   lumpectomy  . BREAST LUMPECTOMY Right 12/29/2017  . COLONOSCOPY WITH PROPOFOL    . EYE SURGERY     Cataract removal  .  PARTIAL MASTECTOMY WITH NEEDLE LOCALIZATION Right 12/29/2017   Procedure: PARTIAL MASTECTOMY WITH NEEDLE LOCALIZATION;  Surgeon: Herbert Pun, MD;  Location: [facility] ORS;  Service: General;   Laterality: Right;  . SKIN CANCER EXCISION     Basil Cell on the nose    Social History   Socioeconomic History  . Marital status: Married    Spouse name: Not on file  . Number of children: Not on file  . Years of education: Not on file  . Highest education level: Not on file  Occupational History  . Not on file  Tobacco Use  . Smoking status: Never Smoker  . Smokeless tobacco: Never Used  Vaping Use  . Vaping Use: Never used  Substance and Sexual Activity  . Alcohol use: Never  . Drug use: Never  . Sexual activity: Not Currently  Other Topics Concern  . Not on file  Social History Narrative  . Not on file   Social Determinants of Health   Financial Resource Strain:   . Difficulty of Paying Living Expenses:   Food Insecurity:   . Worried About Charity fundraiser in the Last Year:   . Arboriculturist in the Last Year:   Transportation Needs:   . Film/video editor (Medical):   Marland Kitchen Lack of Transportation (Non-Medical):   Physical Activity:   . Days of Exercise per Week:   . Minutes of Exercise per Session:   Stress:   . Feeling of Stress :   Social Connections:   . Frequency of Communication with Friends and Family:   . Frequency of Social Gatherings with Friends and Family:   . Attends Religious Services:   . Active Member of Clubs or Organizations:   . Attends Archivist Meetings:   Marland Kitchen Marital Status:   Intimate Partner Violence:   . Fear of Current or Ex-Partner:   . Emotionally Abused:   Marland Kitchen Physically Abused:   . Sexually Abused:     Family History  Problem Relation Age of Onset  . Breast cancer Paternal Aunt 73  . Diabetes Mother   . Diabetes Sister      Current Outpatient Medications:  .  ACCU-CHEK GUIDE test strip, , Disp: , Rfl:  .  Accu-Chek Softclix Lancets lancets, , Disp: , Rfl:  .  acetaminophen (TYLENOL) 325 MG tablet, Take 650 mg by mouth every 6 (six) hours as needed., Disp: , Rfl:  .  anastrozole (ARIMIDEX) 1 MG tablet,  Take 1 tablet by mouth once daily, Disp: 90 tablet, Rfl: 0 .  Calcium Carb-Cholecalciferol (CALCIUM-VITAMIN D) 500-400 MG-UNIT TABS, Take 1 tablet by mouth daily., Disp: , Rfl:  .  citalopram (CELEXA) 20 MG tablet, TAKE 1 TABLET BY MOUTH ONCE DAILY, Disp: , Rfl:  .  latanoprost (XALATAN) 0.005 % ophthalmic solution, Place 1 drop into both eyes at bedtime. , Disp: , Rfl: 5 .  omeprazole (PRILOSEC) 20 MG capsule, Take 20 mg by mouth daily., Disp: , Rfl:  .  simvastatin (ZOCOR) 10 MG tablet, Take 10 mg by mouth daily. , Disp: , Rfl:  .  TRADJENTA 5 MG TABS tablet, Take 5 mg by mouth daily. , Disp: , Rfl:  .  triamterene-hydrochlorothiazide (MAXZIDE) 75-50 MG tablet, Take 1 tablet by mouth daily., Disp: , Rfl: 3 .  mirabegron ER (MYRBETRIQ) 25 MG TB24 tablet, Take by mouth. (Patient not taking: Reported on 09/22/2019), Disp: , Rfl:   Physical exam:  Vitals:   09/22/19 0916  BP:  126/65  Pulse: 81  Resp: 18  Temp: (!) 97.1 F (36.2 C)  TempSrc: Tympanic  SpO2: 99%  Weight: 173 lb 4.8 oz (78.6 kg)  Height: _0  (1.626 m)   Physical Exam Constitutional:      General: She is not in acute distress. Cardiovascular:     Rate and Rhythm: Normal rate and regular rhythm.     Heart sounds: Normal heart sounds.  Pulmonary:     Effort: Pulmonary effort is normal.     Breath sounds: Normal breath sounds.  Abdominal:     General: Bowel sounds are normal.     Palpations: Abdomen is soft.  Skin:    General: Skin is warm and dry.  Neurological:     Mental Status: She is alert and oriented to person, place, and time.    Breast exam was performed in seated and lying down position. Patient is status post right lumpectomy with a well-healed surgical scar. No evidence of any palpable masses. No evidence of axillary adenopathy. No evidence of any palpable masses or lumps in the left breast. No evidence of leftt axillary adenopathy   CMP Latest Ref Rng & Units 04/26/2018  Glucose 70 - 99 mg/dL 210(H)    BUN 8 - 23 mg/dL 28(H)  Creatinine 0.44 - 1.00 mg/dL 1.09(H)  Sodium 135 - 145 mmol/L 138  Potassium 3.5 - 5.1 mmol/L 4.0  Chloride 98 - 111 mmol/L 101  CO2 22 - 32 mmol/L 27  Calcium 8.9 - 10.3 mg/dL 9.3  Total Protein 6.5 - 8.1 g/dL 7.6  Total Bilirubin 0.3 - 1.2 mg/dL 0.7  Alkaline Phos 38 - 126 U/L 60  AST 15 - 41 U/L 22  ALT 0 - 44 U/L 15   CBC Latest Ref Rng & Units 02/23/2018  WBC 4.0 - 10.5 K/uL 7.3  Hemoglobin 12.0 - 15.0 g/dL 13.0  Hematocrit 36 - 46 % 41.5  Platelets 150 - 400 K/uL 281     Assessment and plan- Patient is a 79 y.o. female with right breast ER positive DCIS on Arimidex here for routine follow-up  Clinically patient is doing well with no concerning signs and symptoms of recurrence based on today's exam.  She will be due for a bone density scan in December 2021 which we will order. She is also due for a mammogram in October 2021 which will be coordinated by Dr. Peyton Najjar.  Patient will continue to take Arimidex for 5 years along with calcium and vitamin D.  I will see her back in 6 months for an in person visit   Visit Diagnosis 1. Visit for monitoring Arimidex therapy   2. Ductal carcinoma in situ (DCIS) of right breast      Dr. Randa Evens, MD, MPH [facility] at [facility] [phone] 09/22/2019 10:14 AM

## 2019-10-31 DIAGNOSIS — X32XXXA Exposure to sunlight, initial encounter: Secondary | ICD-10-CM | POA: Diagnosis not present

## 2019-10-31 DIAGNOSIS — D225 Melanocytic nevi of trunk: Secondary | ICD-10-CM | POA: Diagnosis not present

## 2019-10-31 DIAGNOSIS — D2261 Melanocytic nevi of right upper limb, including shoulder: Secondary | ICD-10-CM | POA: Diagnosis not present

## 2019-10-31 DIAGNOSIS — L570 Actinic keratosis: Secondary | ICD-10-CM | POA: Diagnosis not present

## 2019-10-31 DIAGNOSIS — D2271 Melanocytic nevi of right lower limb, including hip: Secondary | ICD-10-CM | POA: Diagnosis not present

## 2019-10-31 DIAGNOSIS — Z85828 Personal history of other malignant neoplasm of skin: Secondary | ICD-10-CM | POA: Diagnosis not present

## 2019-10-31 DIAGNOSIS — D2262 Melanocytic nevi of left upper limb, including shoulder: Secondary | ICD-10-CM | POA: Diagnosis not present

## 2019-11-15 ENCOUNTER — Other Ambulatory Visit: Payer: Self-pay | Admitting: Family Medicine

## 2019-11-15 DIAGNOSIS — Z853 Personal history of malignant neoplasm of breast: Secondary | ICD-10-CM

## 2019-11-17 ENCOUNTER — Other Ambulatory Visit: Payer: Self-pay | Admitting: General Surgery

## 2019-11-17 DIAGNOSIS — Z853 Personal history of malignant neoplasm of breast: Secondary | ICD-10-CM

## 2019-12-25 DIAGNOSIS — E119 Type 2 diabetes mellitus without complications: Secondary | ICD-10-CM | POA: Diagnosis not present

## 2019-12-28 ENCOUNTER — Ambulatory Visit
Admission: RE | Admit: 2019-12-28 | Discharge: 2019-12-28 | Disposition: A | Discharge status: Home / Self Care | Payer: Medicare HMO | Source: Ambulatory Visit | Attending: General Surgery | Admitting: General Surgery

## 2019-12-28 ENCOUNTER — Other Ambulatory Visit: Payer: Self-pay

## 2019-12-28 DIAGNOSIS — Z853 Personal history of malignant neoplasm of breast: Secondary | ICD-10-CM

## 2019-12-28 DIAGNOSIS — R922 Inconclusive mammogram: Secondary | ICD-10-CM | POA: Diagnosis not present

## 2020-01-01 DIAGNOSIS — E119 Type 2 diabetes mellitus without complications: Secondary | ICD-10-CM | POA: Diagnosis not present

## 2020-01-01 DIAGNOSIS — E785 Hyperlipidemia, unspecified: Secondary | ICD-10-CM | POA: Diagnosis not present

## 2020-01-01 DIAGNOSIS — I10 Essential (primary) hypertension: Secondary | ICD-10-CM | POA: Diagnosis not present

## 2020-01-04 DIAGNOSIS — D0511 Intraductal carcinoma in situ of right breast: Secondary | ICD-10-CM | POA: Diagnosis not present

## 2020-02-13 ENCOUNTER — Other Ambulatory Visit: Payer: Self-pay | Admitting: Oncology

## 2020-02-14 DIAGNOSIS — E119 Type 2 diabetes mellitus without complications: Secondary | ICD-10-CM | POA: Diagnosis not present

## 2020-02-19 ENCOUNTER — Ambulatory Visit
Admission: RE | Admit: 2020-02-19 | Discharge: 2020-02-19 | Disposition: A | Discharge status: Home / Self Care | Payer: Medicare HMO | Source: Ambulatory Visit | Attending: Oncology | Admitting: Oncology

## 2020-02-19 ENCOUNTER — Other Ambulatory Visit: Payer: Self-pay

## 2020-02-19 DIAGNOSIS — Z5181 Encounter for therapeutic drug level monitoring: Secondary | ICD-10-CM

## 2020-02-19 DIAGNOSIS — Z79811 Long term (current) use of aromatase inhibitors: Secondary | ICD-10-CM | POA: Diagnosis not present

## 2020-02-19 DIAGNOSIS — Z853 Personal history of malignant neoplasm of breast: Secondary | ICD-10-CM | POA: Diagnosis not present

## 2020-02-19 DIAGNOSIS — Z780 Asymptomatic menopausal state: Secondary | ICD-10-CM | POA: Diagnosis not present

## 2020-02-19 DIAGNOSIS — D0511 Intraductal carcinoma in situ of right breast: Secondary | ICD-10-CM | POA: Insufficient documentation

## 2020-02-19 DIAGNOSIS — H401131 Primary open-angle glaucoma, bilateral, mild stage: Secondary | ICD-10-CM | POA: Diagnosis not present

## 2020-03-13 DIAGNOSIS — H401131 Primary open-angle glaucoma, bilateral, mild stage: Secondary | ICD-10-CM | POA: Diagnosis not present

## 2020-03-15 ENCOUNTER — Telehealth: Payer: Self-pay | Admitting: *Deleted

## 2020-03-15 NOTE — Telephone Encounter (Addendum)
Attempted both phone numbers listed for the patient to notify of change in her appointment with Dr. Baruch Gouty.  Home number was picked up then hung up, no voicemail set up on mobile.   Will attempt to call again and also mail AVS.

## 2020-03-20 ENCOUNTER — Ambulatory Visit: Payer: Medicare HMO | Admitting: Radiation Oncology

## 2020-03-25 ENCOUNTER — Inpatient Hospital Stay: Payer: Medicare HMO | Admitting: Oncology

## 2020-03-25 ENCOUNTER — Ambulatory Visit: Payer: Medicare HMO | Admitting: Radiation Oncology

## 2020-04-02 ENCOUNTER — Ambulatory Visit: Payer: Medicare HMO | Admitting: Radiation Oncology

## 2020-04-02 ENCOUNTER — Inpatient Hospital Stay: Payer: Medicare HMO | Admitting: Oncology

## 2020-04-22 ENCOUNTER — Encounter: Payer: Self-pay | Admitting: Oncology

## 2020-04-22 ENCOUNTER — Ambulatory Visit
Admission: RE | Admit: 2020-04-22 | Discharge: 2020-04-22 | Disposition: A | Discharge status: Home / Self Care | Payer: Medicare HMO | Source: Ambulatory Visit | Attending: Radiation Oncology | Admitting: Radiation Oncology

## 2020-04-22 ENCOUNTER — Inpatient Hospital Stay: Payer: Medicare HMO | Attending: Oncology | Admitting: Oncology

## 2020-04-22 VITALS — BP 134/65 | HR 93 | Temp 96.6°F | Resp 16 | Wt 173.5 lb

## 2020-04-22 VITALS — BP 102/64 | HR 81 | Temp 98.7°F | Resp 16 | Wt 172.3 lb

## 2020-04-22 DIAGNOSIS — C50512 Malignant neoplasm of lower-outer quadrant of left female breast: Secondary | ICD-10-CM

## 2020-04-22 DIAGNOSIS — I10 Essential (primary) hypertension: Secondary | ICD-10-CM | POA: Diagnosis not present

## 2020-04-22 DIAGNOSIS — Z79811 Long term (current) use of aromatase inhibitors: Secondary | ICD-10-CM | POA: Insufficient documentation

## 2020-04-22 DIAGNOSIS — D0511 Intraductal carcinoma in situ of right breast: Secondary | ICD-10-CM | POA: Insufficient documentation

## 2020-04-22 DIAGNOSIS — M8580 Other specified disorders of bone density and structure, unspecified site: Secondary | ICD-10-CM | POA: Insufficient documentation

## 2020-04-22 DIAGNOSIS — Z170 Estrogen receptor positive status [ER+]: Secondary | ICD-10-CM

## 2020-04-22 DIAGNOSIS — Z5181 Encounter for therapeutic drug level monitoring: Secondary | ICD-10-CM

## 2020-04-22 DIAGNOSIS — Z08 Encounter for follow-up examination after completed treatment for malignant neoplasm: Secondary | ICD-10-CM | POA: Diagnosis not present

## 2020-04-22 NOTE — Progress Notes (Signed)
Pt tolerates the AI but she does have achy joints - today it is her shoulders

## 2020-04-22 NOTE — Progress Notes (Signed)
Radiation Oncology Follow up Note  Name: Susan Montgomery   Date:   04/22/2020 MRN:  [id number] DOB: [birth year]    This 80 y.o. female presents to the clinic today for 2-year follow-up status post whole breast radiation to her right breast for ER/PR positive ductal carcinoma in situ.  REFERRING PROVIDER: Maryland Pink, MD  HPI: Patient is an 80 year old female now out 2 years having pleated whole breast radiation to her right breast for ER/PR positive ductal carcinoma in situ seen today in routine follow-up she is doing well.  She specifically denies breast tenderness cough or bone pain.  She is currently on Arimidex tolerating it well without side effect..  She had mammograms back in October which I have reviewed were BI-RADS 2 benign.  COMPLICATIONS OF TREATMENT: none  FOLLOW UP COMPLIANCE: keeps appointments   PHYSICAL EXAM:  BP 134/65   Pulse 93   Temp (!) 96.6 F (35.9 C)   Resp 16   Wt 173 lb 8 oz (78.7 kg)   SpO2 99%   BMI 29.78 kg/m  Lungs are clear to A&P cardiac examination essentially unremarkable with regular rate and rhythm. No dominant mass or nodularity is noted in either breast in 2 positions examined. Incision is well-healed. No axillary or supraclavicular adenopathy is appreciated. Cosmetic result is excellent.  Well-developed well-nourished patient in NAD. HEENT reveals PERLA, EOMI, discs not visualized.  Oral cavity is clear. No oral mucosal lesions are identified. Neck is clear without evidence of cervical or supraclavicular adenopathy. Lungs are clear to A&P. Cardiac examination is essentially unremarkable with regular rate and rhythm without murmur rub or thrill. Abdomen is benign with no organomegaly or masses noted. Motor sensory and DTR levels are equal and symmetric in the upper and lower extremities. Cranial nerves II through XII are grossly intact. Proprioception is intact. No peripheral adenopathy or edema is identified. No motor or sensory levels are noted.  Crude visual fields are within normal range.  RADIOLOGY RESULTS: Mammograms reviewed compatible with above-stated findings  PLAN: Present time patient is doing well now 2 years out with no evidence of disease I am pleased with her overall progress.  She continues on Arimidex without side effect.  I have asked to see her back in 1 year for follow-up.  Patient knows to call with any concerns.  I would like to take this opportunity to thank you for allowing me to participate in the care of your patient.Noreene Filbert, MD

## 2020-04-22 NOTE — Progress Notes (Signed)
Hematology/Oncology Consult note [facility]  Telephone:([phone] [phone]  Patient Care Team: Maryland Pink, MD as PCP - General (Family Medicine)   Name of the patient: Susan Montgomery  [id number]  02-17-1941   Date of visit: 04/22/20  Diagnosis- right breast DCIS ER positive  Chief complaint/ Reason for visit-routine follow-up of right breast DCIS on Arimidex  Heme/Onc history: patient is a 80 year old female with a past medical history significant for osteopenia hypertension who recently underwent bilateral screening mammogram on 11/10/2017 which showed some suspicious calcifications in the right breast. Diagnostic mammogram revealed faint linear calcifications measuring 2.2 x 0.4 x 0.6 cm. No associated mass or distortion. This was followed by a core biopsy which revealed high-grade DCIS with comedonecrosis. Patient has already met with Dr. Peyton Najjar and surgery is planned on 12/29/2017. Patient previously to birth control Premarin for 10 years her paternal great aunt had history of breast cancer in her 44s. No other family history of breast ovarian pancreatic or colon cancer. She has not had any prior abnormal mammograms or breast biopsies. She otherwise feels well today and denies other complaints. She attained menopause at the age of 43. Age of first. At 21 and first childbirth at the age of 29. She is G3, P3 L3.  Final pathology showed high-grade DCIS with calcifications.Distance from closest anterior margin was less than 0.5 mm focally. Extent of DCIS was at least 20 mm, grade 3.She completed adjuvant radiation treatment and started Arimidex in December 2019.    Interval history-patient is tolerating Arimidex along with calcium and vitamin D well.  She reports occasional right shoulder pain which is self-limited.  Denies other complaints at this time  ECOG PS- 1 Pain scale- 2   Review of systems- Review of Systems   Constitutional: Negative for chills, fever, malaise/fatigue and weight loss.  HENT: Negative for congestion, ear discharge and nosebleeds.   Eyes: Negative for blurred vision.  Respiratory: Negative for cough, hemoptysis, sputum production, shortness of breath and wheezing.   Cardiovascular: Negative for chest pain, palpitations, orthopnea and claudication.  Gastrointestinal: Negative for abdominal pain, blood in stool, constipation, diarrhea, heartburn, melena, nausea and vomiting.  Genitourinary: Negative for dysuria, flank pain, frequency, hematuria and urgency.  Musculoskeletal: Positive for joint pain. Negative for back pain and myalgias.  Skin: Negative for rash.  Neurological: Negative for dizziness, tingling, focal weakness, seizures, weakness and headaches.  Endo/Heme/Allergies: Does not bruise/bleed easily.  Psychiatric/Behavioral: Negative for depression and suicidal ideas. The patient does not have insomnia.       Allergies  Allergen Reactions  . Codeine Anaphylaxis  . Penicillins Anaphylaxis  . Sulfa Antibiotics Swelling     Past Medical History:  Diagnosis Date  . Anxiety   . Breast cancer ([place]) 12/2017  . Cancer ([place])    skin  . Diabetes ([place])   . Diabetes mellitus without complication ([place])   . Dyspnea    With walking fast or activity around the house  . GERD (gastroesophageal reflux disease)   . History of kidney stones   . Hypertension   . Personal history of radiation therapy      Past Surgical History:  Procedure Laterality Date  . ABDOMINAL HYSTERECTOMY    . ACHILLES TENDON REPAIR Right   . BREAST BIOPSY Right 12/09/2017   -"coil"  clip High-grade comedo type DCIS  . BREAST EXCISIONAL BIOPSY Right 12/29/2017   lumpectomy  . BREAST LUMPECTOMY Right 12/29/2017   DUCTAL CARCINOMA IN SITU, HIGH-GRADE  .  COLONOSCOPY WITH PROPOFOL    . EYE SURGERY     Cataract removal  . PARTIAL MASTECTOMY WITH NEEDLE LOCALIZATION Right 12/29/2017   Procedure:  PARTIAL MASTECTOMY WITH NEEDLE LOCALIZATION;  Surgeon: Herbert Pun, MD;  Location: [facility] ORS;  Service: General;  Laterality: Right;  . SKIN CANCER EXCISION     Basil Cell on the nose    Social History   Socioeconomic History  . Marital status: Married    Spouse name: Not on file  . Number of children: Not on file  . Years of education: Not on file  . Highest education level: Not on file  Occupational History  . Not on file  Tobacco Use  . Smoking status: Never Smoker  . Smokeless tobacco: Never Used  Vaping Use  . Vaping Use: Never used  Substance and Sexual Activity  . Alcohol use: Never  . Drug use: Never  . Sexual activity: Not Currently  Other Topics Concern  . Not on file  Social History Narrative  . Not on file   Social Determinants of Health   Financial Resource Strain: Not on file  Food Insecurity: Not on file  Transportation Needs: Not on file  Physical Activity: Not on file  Stress: Not on file  Social Connections: Not on file  Intimate Partner Violence: Not on file    Family History  Problem Relation Age of Onset  . Breast cancer Paternal Aunt 73  . Diabetes Mother   . Diabetes Sister      Current Outpatient Medications:  .  ACCU-CHEK GUIDE test strip, , Disp: , Rfl:  .  Accu-Chek Softclix Lancets lancets, , Disp: , Rfl:  .  acetaminophen (TYLENOL) 325 MG tablet, Take 650 mg by mouth every 6 (six) hours as needed., Disp: , Rfl:  .  anastrozole (ARIMIDEX) 1 MG tablet, Take 1 tablet by mouth once daily, Disp: 90 tablet, Rfl: 0 .  Calcium Carb-Cholecalciferol (CALCIUM-VITAMIN D) 500-400 MG-UNIT TABS, Take 1 tablet by mouth daily., Disp: , Rfl:  .  citalopram (CELEXA) 20 MG tablet, TAKE 1 TABLET BY MOUTH ONCE DAILY, Disp: , Rfl:  .  glipiZIDE (GLUCOTROL XL) 2.5 MG 24 hr tablet, Take 1 tablet by mouth daily., Disp: , Rfl:  .  latanoprost (XALATAN) 0.005 % ophthalmic solution, Place 1 drop into both eyes at bedtime. , Disp: , Rfl: 5 .   omeprazole (PRILOSEC) 20 MG capsule, Take 20 mg by mouth daily., Disp: , Rfl:  .  simvastatin (ZOCOR) 10 MG tablet, Take 10 mg by mouth daily. , Disp: , Rfl:  .  triamterene-hydrochlorothiazide (MAXZIDE) 75-50 MG tablet, Take 1 tablet by mouth daily., Disp: , Rfl: 3  Physical exam:  Vitals:   04/22/20 1128  BP: 102/64  Pulse: 81  Resp: 16  Temp: 98.7 F (37.1 C)  TempSrc: Oral  Weight: 172 lb 4.8 oz (78.2 kg)   Physical Exam Constitutional:      General: She is not in acute distress. Eyes:     Extraocular Movements: EOM normal.  Cardiovascular:     Rate and Rhythm: Normal rate and regular rhythm.     Heart sounds: Normal heart sounds.  Pulmonary:     Effort: Pulmonary effort is normal.     Breath sounds: Normal breath sounds.  Abdominal:     General: Bowel sounds are normal.     Palpations: Abdomen is soft.  Skin:    General: Skin is warm and dry.  Neurological:     Mental  Status: She is alert and oriented to person, place, and time.      CMP Latest Ref Rng & Units 04/26/2018  Glucose 70 - 99 mg/dL 210(H)  BUN 8 - 23 mg/dL 28(H)  Creatinine 0.44 - 1.00 mg/dL 1.09(H)  Sodium 135 - 145 mmol/L 138  Potassium 3.5 - 5.1 mmol/L 4.0  Chloride 98 - 111 mmol/L 101  CO2 22 - 32 mmol/L 27  Calcium 8.9 - 10.3 mg/dL 9.3  Total Protein 6.5 - 8.1 g/dL 7.6  Total Bilirubin 0.3 - 1.2 mg/dL 0.7  Alkaline Phos 38 - 126 U/L 60  AST 15 - 41 U/L 22  ALT 0 - 44 U/L 15   CBC Latest Ref Rng & Units 02/23/2018  WBC 4.0 - 10.5 K/uL 7.3  Hemoglobin 12.0 - 15.0 g/dL 13.0  Hematocrit 36.0 - 46.0 % 41.5  Platelets 150 - 400 K/uL 281      Assessment and plan- Patient is a 80 y.o. female with right breast DCIS ER positive on Arimidex here for routine follow-up  Breast exam was deferred today as patient did get breast exam by radiation oncology today.  Overall patient is doing well on Arimidex and reports no significant symptoms other than self-limited right shoulder pain.  She is also  taking her calcium and vitamin D.  Mammogram from October 2021 was unremarkable.  I will see her back in 6 months.  No labs   Visit Diagnosis 1. Visit for monitoring Arimidex therapy   2. Ductal carcinoma in situ (DCIS) of right breast      Dr. Randa Evens, MD, MPH [facility] at [facility] [phone] 04/22/2020 1:56 PM

## 2020-05-08 DIAGNOSIS — K5900 Constipation, unspecified: Secondary | ICD-10-CM | POA: Diagnosis not present

## 2020-05-08 DIAGNOSIS — E785 Hyperlipidemia, unspecified: Secondary | ICD-10-CM | POA: Diagnosis not present

## 2020-05-08 DIAGNOSIS — I129 Hypertensive chronic kidney disease with stage 1 through stage 4 chronic kidney disease, or unspecified chronic kidney disease: Secondary | ICD-10-CM | POA: Diagnosis not present

## 2020-05-08 DIAGNOSIS — E1165 Type 2 diabetes mellitus with hyperglycemia: Secondary | ICD-10-CM | POA: Diagnosis not present

## 2020-05-08 DIAGNOSIS — Z0000 Encounter for general adult medical examination without abnormal findings: Secondary | ICD-10-CM | POA: Diagnosis not present

## 2020-05-08 DIAGNOSIS — N1831 Chronic kidney disease, stage 3a: Secondary | ICD-10-CM | POA: Diagnosis not present

## 2020-05-08 DIAGNOSIS — E1122 Type 2 diabetes mellitus with diabetic chronic kidney disease: Secondary | ICD-10-CM | POA: Diagnosis not present

## 2020-05-09 ENCOUNTER — Other Ambulatory Visit: Payer: Self-pay | Admitting: Oncology

## 2020-05-10 DIAGNOSIS — E119 Type 2 diabetes mellitus without complications: Secondary | ICD-10-CM | POA: Diagnosis not present

## 2020-05-10 DIAGNOSIS — E785 Hyperlipidemia, unspecified: Secondary | ICD-10-CM | POA: Diagnosis not present

## 2020-05-10 DIAGNOSIS — I10 Essential (primary) hypertension: Secondary | ICD-10-CM | POA: Diagnosis not present

## 2020-06-06 IMAGING — MG MM PLC BREAST LOC DEV 1ST LESION INC*R*
7 series · 7 of 7 positions shown · non-contrast
Comparison: Previous exams.

CLINICAL DATA: Right breast cancer for needle localization prior to
surgery.

EXAM:
NEEDLE LOCALIZATION OF THE RIGHT BREAST WITH MAMMO GUIDANCE

[R CC (1 of 5)]
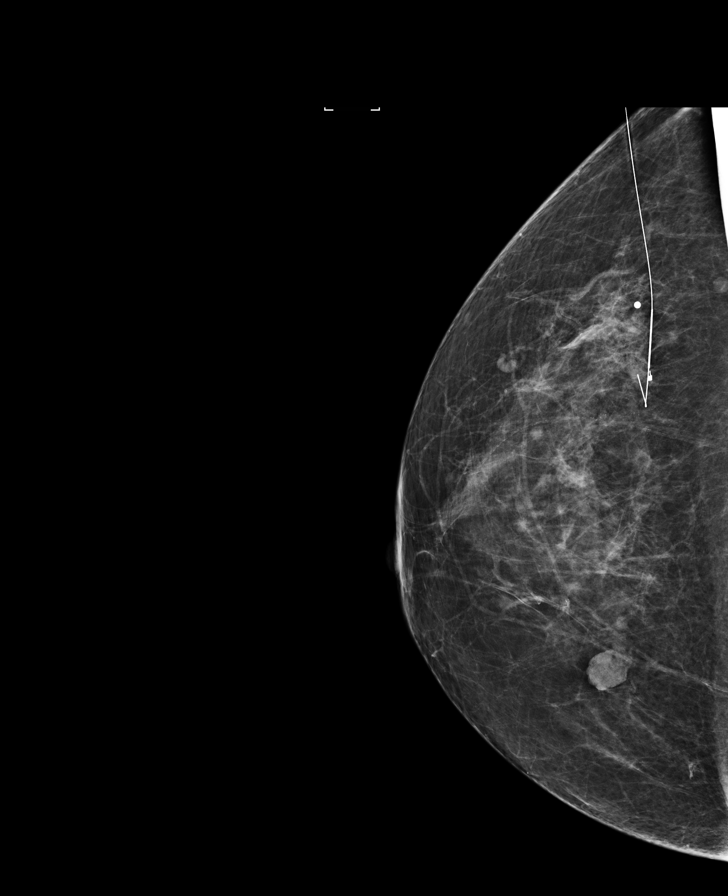

[R CC (2 of 5)]
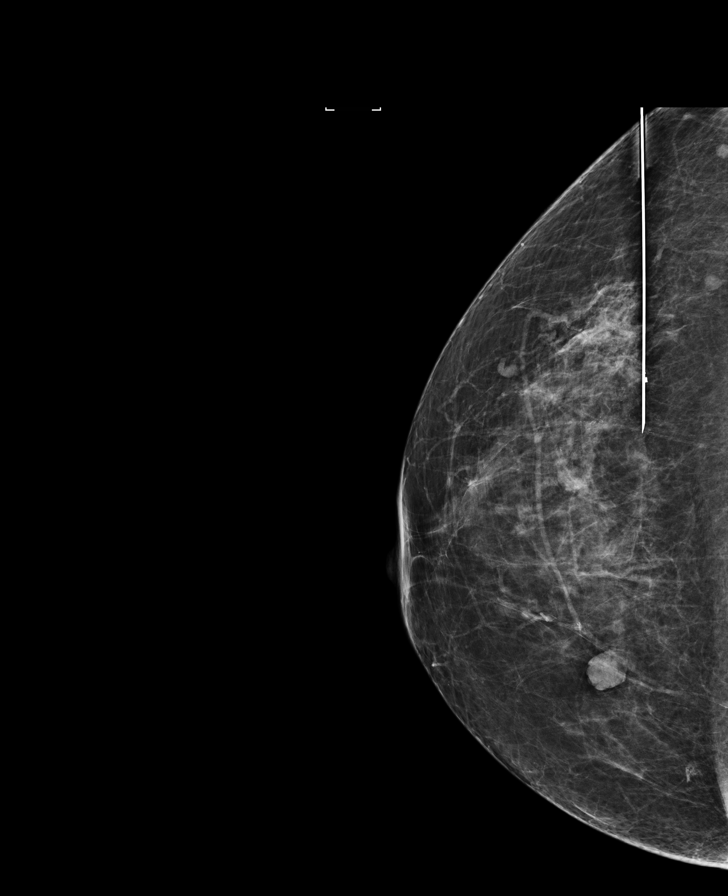

[R CC (3 of 5)]
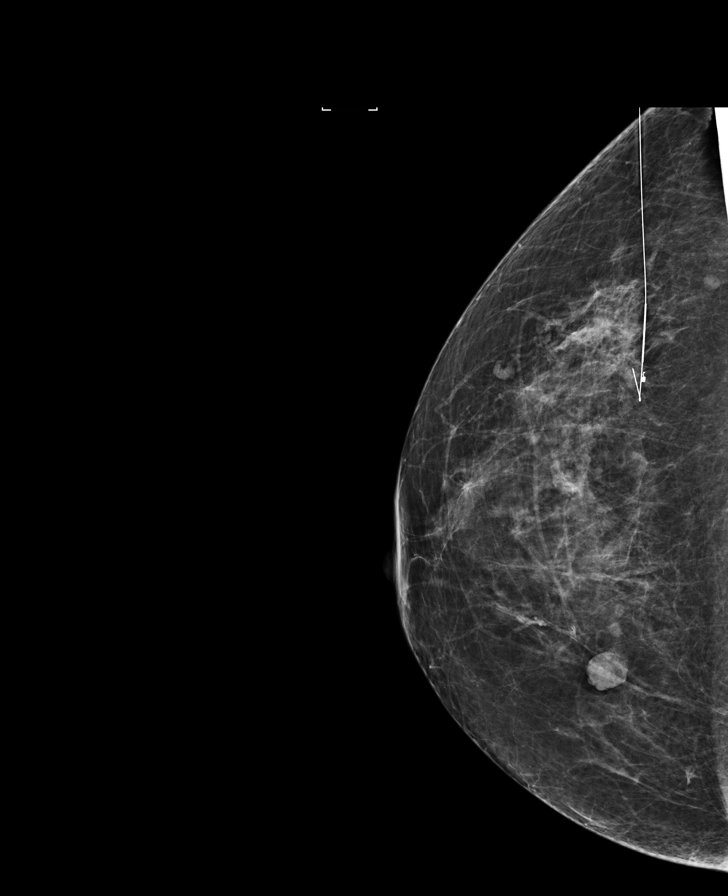

[R LM (1 of 2)]
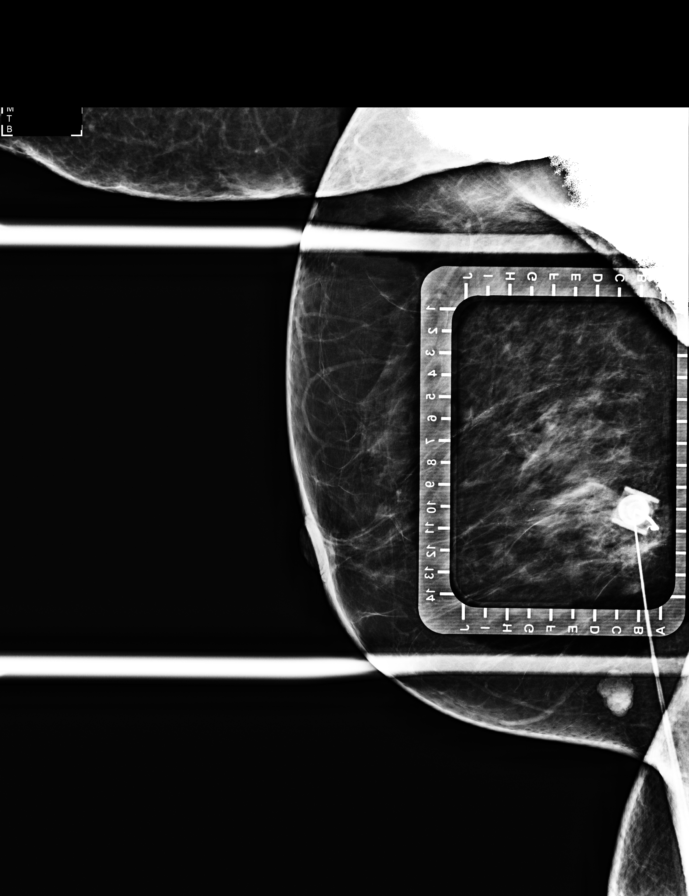

[R CC (4 of 5)]
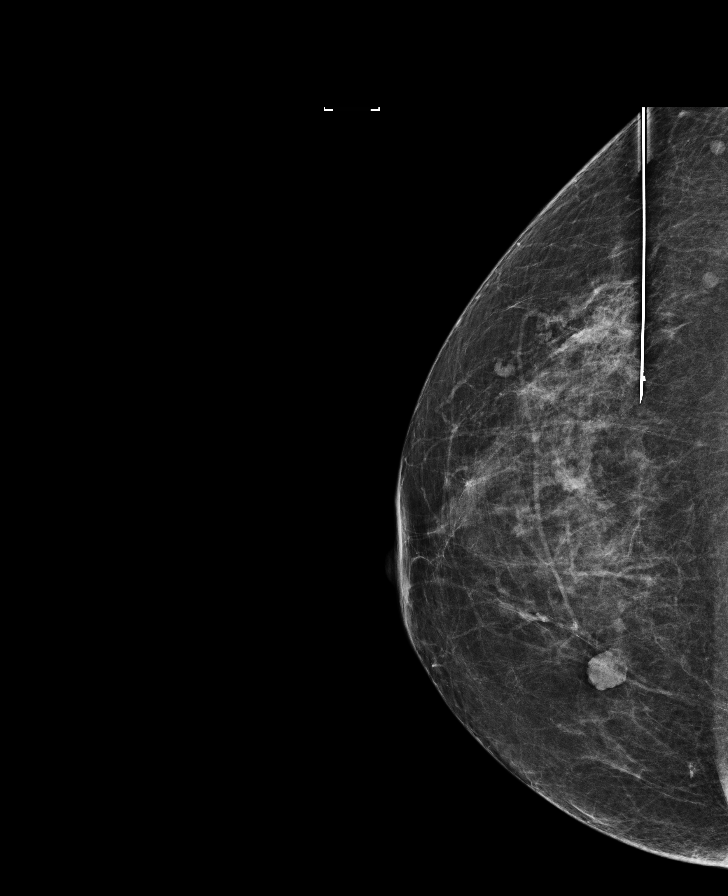

[R LM (2 of 2)]
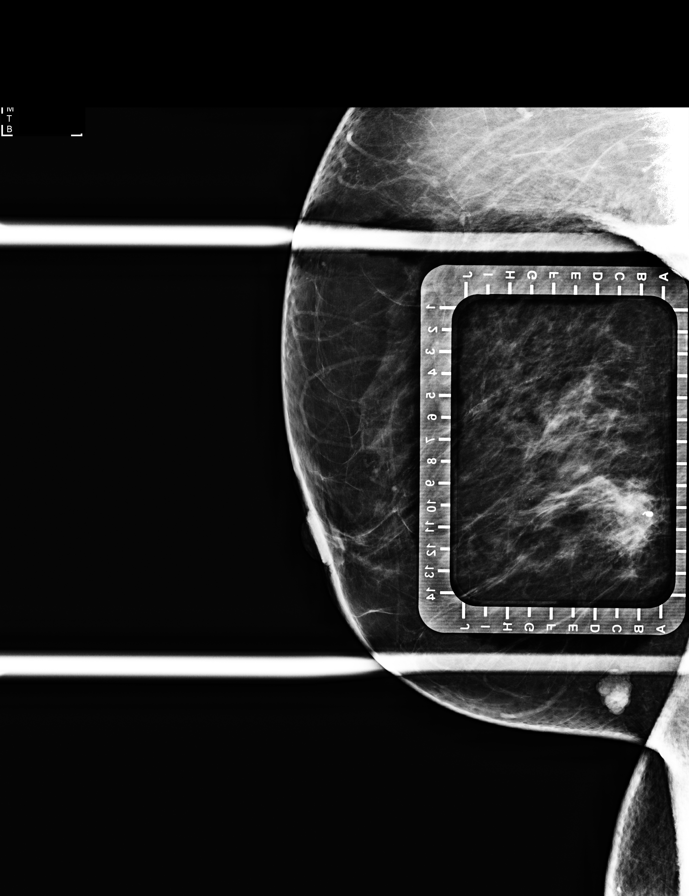

[R CC (5 of 5)]
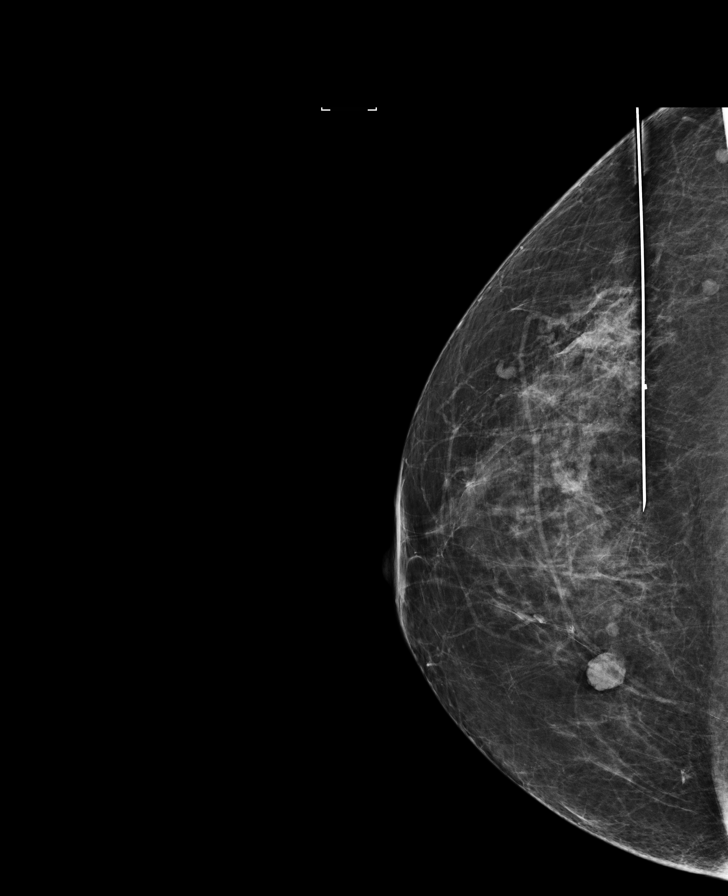

[7 of 7 positions shown; findings below may reference images not displayed]

FINDINGS: Patient presents for needle localization prior to right breast
surgery. I met with the patient and we discussed the procedure of
needle localization including benefits and alternatives. We
discussed the high likelihood of a successful procedure. We
discussed the risks of the procedure, including infection, bleeding,
tissue injury, and further surgery. Informed, written consent was
given. The usual time-out protocol was performed immediately prior
to the procedure.

Using mammographic guidance, sterile technique, 1% lidocaine and a
15 cm modified Kopans needle, biopsy clip localized using lateral
approach. The images were marked for Dr. Klever.
IMPRESSION: Needle localization right breast. No apparent complications.

## 2020-06-06 IMAGING — MG BREAST SURGICAL SPECIMEN
1 series · 1 of 1 positions shown · non-contrast
Comparison: Previous exam(s).

CLINICAL DATA: Status post right breast lumpectomy.

EXAM:
SPECIMEN RADIOGRAPH OF THE RIGHT BREAST

[R SPECIMEN]
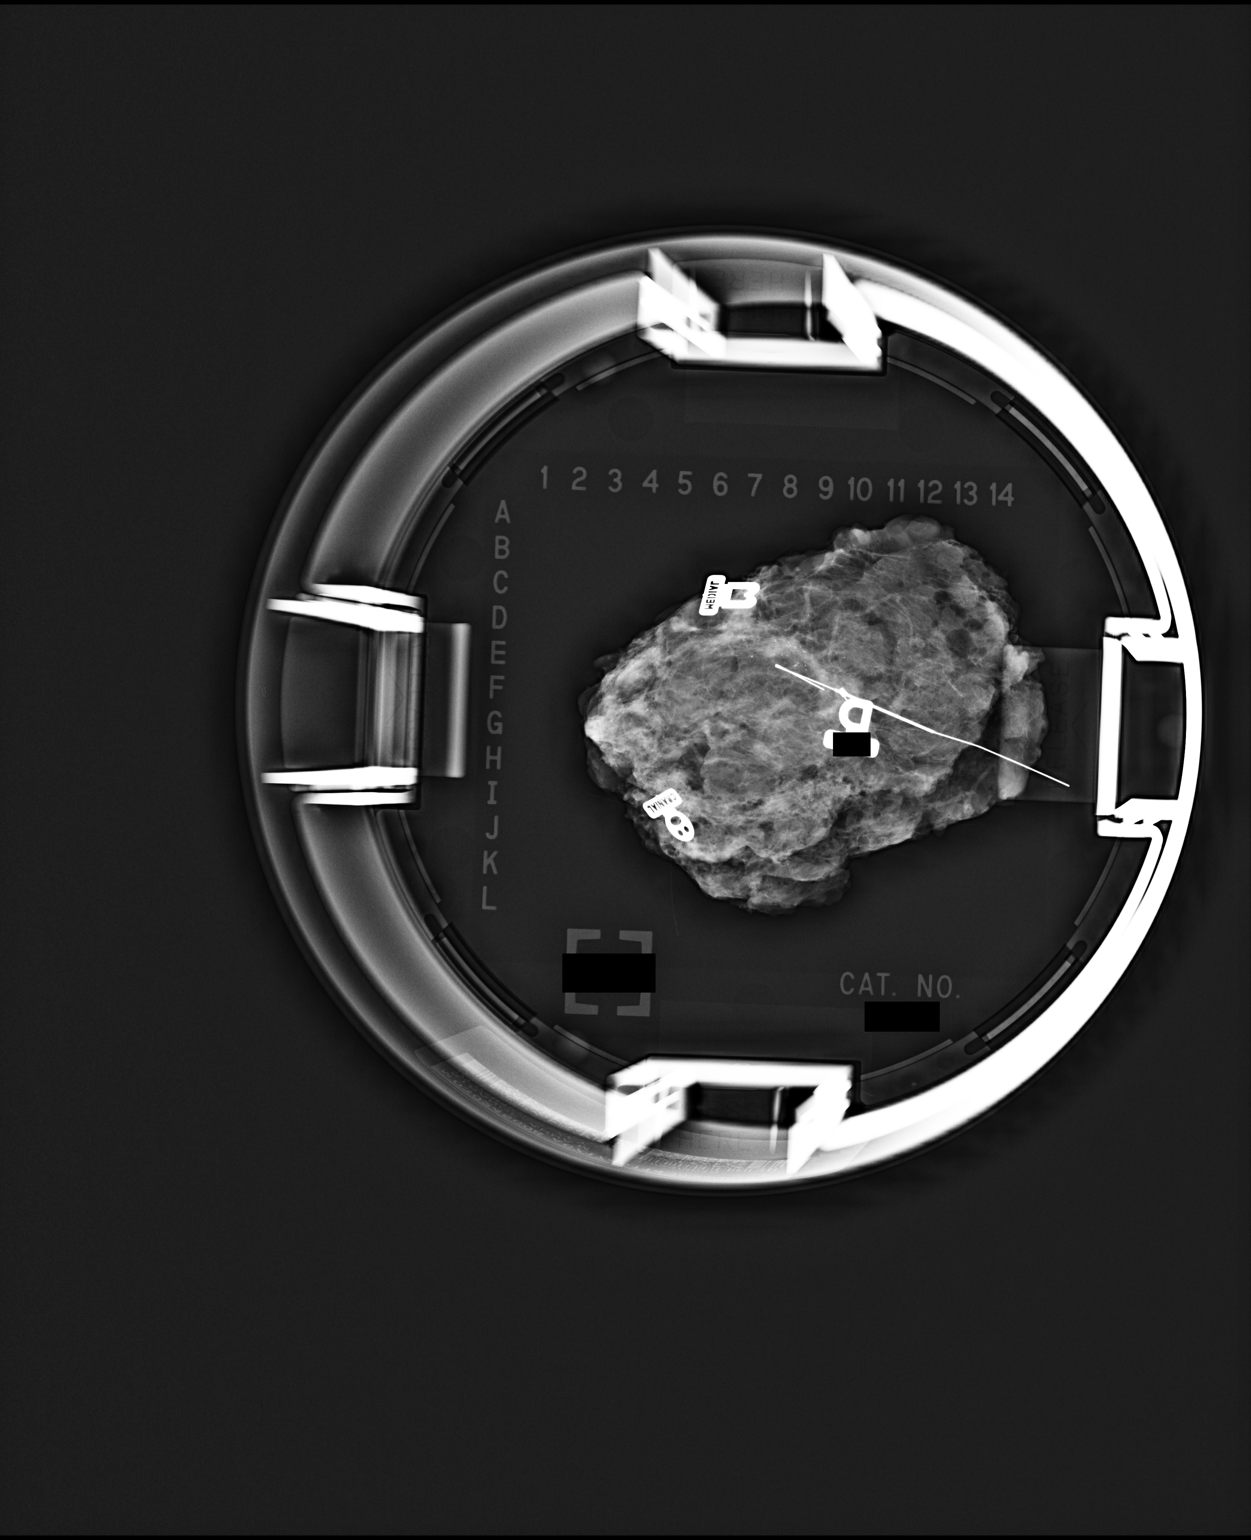

[1 of 1 positions shown; findings below may reference images not displayed]

FINDINGS: Status post excision of the right breast. The wire tip,
calcifications and biopsy marker clip are present and are marked for
pathology.
IMPRESSION: Specimen radiograph of the right breast.

## 2020-06-11 DIAGNOSIS — H401131 Primary open-angle glaucoma, bilateral, mild stage: Secondary | ICD-10-CM | POA: Diagnosis not present

## 2020-08-06 ENCOUNTER — Other Ambulatory Visit: Payer: Self-pay | Admitting: Oncology

## 2020-10-21 ENCOUNTER — Other Ambulatory Visit: Payer: Self-pay

## 2020-10-21 ENCOUNTER — Inpatient Hospital Stay: Payer: Medicare HMO | Attending: Oncology | Admitting: Oncology

## 2020-10-21 VITALS — BP 124/49 | HR 88 | Temp 98.0°F | Resp 20 | Wt 176.7 lb

## 2020-10-21 DIAGNOSIS — Z86000 Personal history of in-situ neoplasm of breast: Secondary | ICD-10-CM | POA: Diagnosis not present

## 2020-10-21 DIAGNOSIS — D0511 Intraductal carcinoma in situ of right breast: Secondary | ICD-10-CM | POA: Insufficient documentation

## 2020-10-21 DIAGNOSIS — Z08 Encounter for follow-up examination after completed treatment for malignant neoplasm: Secondary | ICD-10-CM

## 2020-10-24 ENCOUNTER — Encounter: Payer: Self-pay | Admitting: Oncology

## 2020-10-24 NOTE — Progress Notes (Signed)
Hematology/Oncology Consult note [facility]  Telephone:([phone] [phone]  Patient Care Team: [place] Pink, MD as PCP - General (Family Medicine)   Name of the patient: Susan Montgomery  [id number]  1940/09/10   Date of visit: 10/24/20  Diagnosis- right breast DCIS ER positive  Chief complaint/ Reason for visit-routine follow-up of right breast DCIS on Arimidex  Heme/Onc history: patient is a 80 year old female with a past medical history significant for osteopenia hypertension who recently underwent bilateral screening mammogram on 11/10/2017 which showed some suspicious calcifications in the right breast.  Diagnostic mammogram revealed faint linear calcifications measuring 2.2 x 0.4 x 0.6 cm.  No associated mass or distortion.  This was followed by a core biopsy which revealed high-grade DCIS with comedonecrosis.  Patient has already met with Dr. Peyton Najjar and surgery is planned on 12/29/2017.  Patient previously to birth control Premarin for 10 years her paternal great aunt had history of breast cancer in her 71s.  No other family history of breast ovarian pancreatic or colon cancer.  She has not had any prior abnormal mammograms or breast biopsies.  She otherwise feels well today and denies other complaints.  She attained menopause at the age of 8.  Age of first.  At 17 and first childbirth at the age of 30.  She is G3, P3 L3.   Final pathology showed high-grade DCIS with calcifications.Distance from closest anterior margin was less than 0.5 mm focally.  Extent of DCIS was at least 20 mm, grade 3.  She completed adjuvant radiation treatment and started Arimidex in December 2019.      Interval history- patient is tolerating her medication well along with calcium and vitamin D.  She denies any specific complaints at this time.  Denies any significant joint pains or hot flashes  ECOG PS- 1 Pain scale- 0   Review of systems- Review of Systems   Constitutional:  Negative for chills, fever, malaise/fatigue and weight loss.  HENT:  Negative for congestion, ear discharge and nosebleeds.   Eyes:  Negative for blurred vision.  Respiratory:  Negative for cough, hemoptysis, sputum production, shortness of breath and wheezing.   Cardiovascular:  Negative for chest pain, palpitations, orthopnea and claudication.  Gastrointestinal:  Negative for abdominal pain, blood in stool, constipation, diarrhea, heartburn, melena, nausea and vomiting.  Genitourinary:  Negative for dysuria, flank pain, frequency, hematuria and urgency.  Musculoskeletal:  Negative for back pain, joint pain and myalgias.  Skin:  Negative for rash.  Neurological:  Negative for dizziness, tingling, focal weakness, seizures, weakness and headaches.  Endo/Heme/Allergies:  Does not bruise/bleed easily.  Psychiatric/Behavioral:  Negative for depression and suicidal ideas. The patient does not have insomnia.      Allergies  Allergen Reactions   Codeine Anaphylaxis   Penicillins Anaphylaxis   Sulfa Antibiotics Swelling     Past Medical History:  Diagnosis Date   Anxiety    Breast cancer ([place]) 12/2017   Cancer ([place])    skin   Diabetes ([place])    Diabetes mellitus without complication ([place])    Dyspnea    With walking fast or activity around the house   GERD (gastroesophageal reflux disease)    History of kidney stones    Hypertension    Personal history of radiation therapy      Past Surgical History:  Procedure Laterality Date   ABDOMINAL HYSTERECTOMY     ACHILLES TENDON REPAIR Right    BREAST BIOPSY Right 12/09/2017   -"  coil"  clip High-grade comedo type DCIS   BREAST EXCISIONAL BIOPSY Right 12/29/2017   lumpectomy   BREAST LUMPECTOMY Right 12/29/2017   DUCTAL CARCINOMA IN SITU, HIGH-GRADE   COLONOSCOPY WITH PROPOFOL     EYE SURGERY     Cataract removal   PARTIAL MASTECTOMY WITH NEEDLE LOCALIZATION Right 12/29/2017   Procedure: PARTIAL MASTECTOMY WITH  NEEDLE LOCALIZATION;  Surgeon: Herbert Pun, MD;  Location: [facility] ORS;  Service: General;  Laterality: Right;   SKIN CANCER EXCISION     Basil Cell on the nose    Social History   Socioeconomic History   Marital status: Married    Spouse name: Not on file   Number of children: Not on file   Years of education: Not on file   Highest education level: Not on file  Occupational History   Not on file  Tobacco Use   Smoking status: Never   Smokeless tobacco: Never  Vaping Use   Vaping Use: Never used  Substance and Sexual Activity   Alcohol use: Never   Drug use: Never   Sexual activity: Not Currently  Other Topics Concern   Not on file  Social History Narrative   Not on file   Social Determinants of Health   Financial Resource Strain: Not on file  Food Insecurity: Not on file  Transportation Needs: Not on file  Physical Activity: Not on file  Stress: Not on file  Social Connections: Not on file  Intimate Partner Violence: Not on file    Family History  Problem Relation Age of Onset   Breast cancer Paternal Aunt 73   Diabetes Mother    Diabetes Sister      Current Outpatient Medications:    ACCU-CHEK GUIDE test strip, , Disp: , Rfl:    Accu-Chek Softclix Lancets lancets, , Disp: , Rfl:    acetaminophen (TYLENOL) 325 MG tablet, Take 650 mg by mouth every 6 (six) hours as needed., Disp: , Rfl:    anastrozole (ARIMIDEX) 1 MG tablet, Take 1 tablet by mouth once daily, Disp: 90 tablet, Rfl: 0   Calcium Carb-Cholecalciferol (CALCIUM-VITAMIN D) 500-400 MG-UNIT TABS, Take 1 tablet by mouth daily., Disp: , Rfl:    citalopram (CELEXA) 20 MG tablet, TAKE 1 TABLET BY MOUTH ONCE DAILY, Disp: , Rfl:    glipiZIDE (GLUCOTROL XL) 2.5 MG 24 hr tablet, Take 1 tablet by mouth daily., Disp: , Rfl:    latanoprost (XALATAN) 0.005 % ophthalmic solution, Place 1 drop into both eyes at bedtime. , Disp: , Rfl: 5   omeprazole (PRILOSEC) 20 MG capsule, Take 20 mg by mouth daily., Disp:  , Rfl:    triamterene-hydrochlorothiazide (MAXZIDE) 75-50 MG tablet, Take 1 tablet by mouth daily., Disp: , Rfl: 3   simvastatin (ZOCOR) 10 MG tablet, Take 10 mg by mouth daily. , Disp: , Rfl:   Physical exam:  Vitals:   10/21/20 1313  BP: (!) 124/49  Pulse: 88  Resp: 20  Temp: 98 F (36.7 C)  TempSrc: Tympanic  SpO2: 98%  Weight: 176 lb 11.2 oz (80.2 kg)   Physical Exam Constitutional:      General: She is not in acute distress. Cardiovascular:     Rate and Rhythm: Normal rate and regular rhythm.     Heart sounds: Normal heart sounds.  Pulmonary:     Effort: Pulmonary effort is normal.     Breath sounds: Normal breath sounds.  Abdominal:     General: Bowel sounds are normal.  Palpations: Abdomen is soft.  Skin:    General: Skin is warm and dry.  Neurological:     Mental Status: She is alert and oriented to person, place, and time.    Breast exam was performed in seated and lying down position. Patient is status post right lumpectomy with a well-healed surgical scar. No evidence of any palpable masses. No evidence of axillary adenopathy. No evidence of any palpable masses or lumps in the left breast. No evidence of leftt axillary adenopathy    CMP Latest Ref Rng & Units 04/26/2018  Glucose 70 - 99 mg/dL 210(H)  BUN 8 - 23 mg/dL 28(H)  Creatinine 0.44 - 1.00 mg/dL 1.09(H)  Sodium 135 - 145 mmol/L 138  Potassium 3.5 - 5.1 mmol/L 4.0  Chloride 98 - 111 mmol/L 101  CO2 22 - 32 mmol/L 27  Calcium 8.9 - 10.3 mg/dL 9.3  Total Protein 6.5 - 8.1 g/dL 7.6  Total Bilirubin 0.3 - 1.2 mg/dL 0.7  Alkaline Phos 38 - 126 U/L 60  AST 15 - 41 U/L 22  ALT 0 - 44 U/L 15   CBC Latest Ref Rng & Units 02/23/2018  WBC 4.0 - 10.5 K/uL 7.3  Hemoglobin 12.0 - 15.0 g/dL 13.0  Hematocrit 36.0 - 46.0 % 41.5  Platelets 150 - 400 K/uL 281     Assessment and plan- Patient is a 80 y.o. female with right breast DCIS ER positive on Arimidex here for routine follow-up  Patient is  tolerating Arimidex well along with calcium and vitamin D without any significant side effects.Clinically doing well with no concerning signs and symptoms of recurrence based on today's exam.  She is due for a mammogram in October 2022 which we will schedule.  I will see her back in 6 months   Visit Diagnosis 1. Encounter for follow-up surveillance of ductal carcinoma in situ (DCIS) of breast      Dr. Randa Evens, MD, MPH [facility] at [facility] [phone] 10/24/2020 7:16 AM

## 2020-10-30 DIAGNOSIS — D2262 Melanocytic nevi of left upper limb, including shoulder: Secondary | ICD-10-CM | POA: Diagnosis not present

## 2020-10-30 DIAGNOSIS — D225 Melanocytic nevi of trunk: Secondary | ICD-10-CM | POA: Diagnosis not present

## 2020-10-30 DIAGNOSIS — L821 Other seborrheic keratosis: Secondary | ICD-10-CM | POA: Diagnosis not present

## 2020-10-30 DIAGNOSIS — D2271 Melanocytic nevi of right lower limb, including hip: Secondary | ICD-10-CM | POA: Diagnosis not present

## 2020-10-30 DIAGNOSIS — D2272 Melanocytic nevi of left lower limb, including hip: Secondary | ICD-10-CM | POA: Diagnosis not present

## 2020-10-30 DIAGNOSIS — Z85828 Personal history of other malignant neoplasm of skin: Secondary | ICD-10-CM | POA: Diagnosis not present

## 2020-11-07 ENCOUNTER — Other Ambulatory Visit: Payer: Self-pay | Admitting: Oncology

## 2020-11-12 DIAGNOSIS — E119 Type 2 diabetes mellitus without complications: Secondary | ICD-10-CM | POA: Diagnosis not present

## 2020-11-18 DIAGNOSIS — I129 Hypertensive chronic kidney disease with stage 1 through stage 4 chronic kidney disease, or unspecified chronic kidney disease: Secondary | ICD-10-CM | POA: Diagnosis not present

## 2020-11-18 DIAGNOSIS — E1122 Type 2 diabetes mellitus with diabetic chronic kidney disease: Secondary | ICD-10-CM | POA: Diagnosis not present

## 2020-11-18 DIAGNOSIS — N1831 Chronic kidney disease, stage 3a: Secondary | ICD-10-CM | POA: Diagnosis not present

## 2020-11-18 DIAGNOSIS — Z1211 Encounter for screening for malignant neoplasm of colon: Secondary | ICD-10-CM | POA: Diagnosis not present

## 2020-12-30 DIAGNOSIS — H401131 Primary open-angle glaucoma, bilateral, mild stage: Secondary | ICD-10-CM | POA: Diagnosis not present

## 2020-12-30 DIAGNOSIS — Z0100 Encounter for examination of eyes and vision without abnormal findings: Secondary | ICD-10-CM | POA: Diagnosis not present

## 2020-12-30 DIAGNOSIS — E119 Type 2 diabetes mellitus without complications: Secondary | ICD-10-CM | POA: Diagnosis not present

## 2020-12-31 ENCOUNTER — Ambulatory Visit
Admission: RE | Admit: 2020-12-31 | Discharge: 2020-12-31 | Disposition: A | Discharge status: Home / Self Care | Payer: Medicare HMO | Source: Ambulatory Visit | Attending: Oncology | Admitting: Oncology

## 2020-12-31 ENCOUNTER — Other Ambulatory Visit: Payer: Self-pay

## 2020-12-31 DIAGNOSIS — Z08 Encounter for follow-up examination after completed treatment for malignant neoplasm: Secondary | ICD-10-CM | POA: Diagnosis not present

## 2020-12-31 DIAGNOSIS — Z86000 Personal history of in-situ neoplasm of breast: Secondary | ICD-10-CM | POA: Insufficient documentation

## 2020-12-31 DIAGNOSIS — Z1231 Encounter for screening mammogram for malignant neoplasm of breast: Secondary | ICD-10-CM | POA: Diagnosis not present

## 2021-02-17 DIAGNOSIS — N1831 Chronic kidney disease, stage 3a: Secondary | ICD-10-CM | POA: Diagnosis not present

## 2021-03-04 DIAGNOSIS — Z86010 Personal history of colonic polyps: Secondary | ICD-10-CM | POA: Diagnosis not present

## 2021-03-18 DIAGNOSIS — Z86010 Personal history of colonic polyps: Secondary | ICD-10-CM | POA: Diagnosis not present

## 2021-04-14 ENCOUNTER — Ambulatory Visit: Payer: Self-pay

## 2021-04-14 NOTE — Telephone Encounter (Signed)
°  Chief Complaint: cough Symptoms: coughing so frequent pt couldn't talk to me, green thick mucus, gets short winded at times Frequency: since Friday Pertinent Negatives: NA Disposition: [] ED /[x] Urgent Care (no appt availability in office) / [] Appointment(In office/virtual)/ []  [facility]/ [] Home Care/ [] Refused Recommended Disposition /[] [facility]/ []  Follow-up with PCP Additional Notes: Pt's husband called PCP and states that pt needed to take COVID test and couldn't see her today. Advised him d/t her cough and SOB to do to UC so she could be seen and they can test her there for COVID. He was given address and phone number to [facility] in [place].    Summary: possible covid   Pts husband called and stated pt has a deep chest cough with green phlegm and he doesn't know if she has covid and asked if he should go to the ER or to get  a covid test / please advise      Reason for Disposition  [1] MILD difficulty breathing (e.g., minimal/no SOB at rest, SOB with walking, pulse <100) AND [2] still present when not coughing  Answer Assessment - Initial Assessment Questions 1. ONSET: "When did the cough begin?"      friday 2. SEVERITY: "How bad is the cough today?"      severe 3. SPUTUM: "Describe the color of your sputum" (none, dry cough; clear, white, yellow, green)     green 5. DIFFICULTY BREATHING: "Are you having difficulty breathing?" If Yes, ask: "How bad is it?" (e.g., mild, moderate, severe)    - MILD: No SOB at rest, mild SOB with walking, speaks normally in sentences, can lie down, no retractions, pulse < 100.    - MODERATE: SOB at rest, SOB with minimal exertion and prefers to sit, cannot lie down flat, speaks in phrases, mild retractions, audible wheezing, pulse 100-120.    - SEVERE: Very SOB at rest, speaks in single words, struggling to breathe, sitting hunched forward, retractions, pulse > 120      Mild moderate  Protocols used: Cough - Acute  Productive-A-AH

## 2021-04-22 ENCOUNTER — Telehealth: Payer: Self-pay | Admitting: *Deleted

## 2021-04-22 NOTE — Telephone Encounter (Signed)
Patient would like to reschedule missed appointments from 04/25/2021.

## 2021-04-23 ENCOUNTER — Ambulatory Visit: Payer: Medicare HMO | Admitting: Radiation Oncology

## 2021-04-25 ENCOUNTER — Inpatient Hospital Stay: Payer: Medicare HMO | Admitting: Nurse Practitioner

## 2021-04-25 ENCOUNTER — Ambulatory Visit: Payer: Medicare HMO | Admitting: Nurse Practitioner

## 2021-05-01 ENCOUNTER — Encounter (INDEPENDENT_AMBULATORY_CARE_PROVIDER_SITE_OTHER): Payer: Self-pay

## 2021-05-01 ENCOUNTER — Ambulatory Visit: Payer: Medicare HMO | Admitting: Radiation Oncology

## 2021-05-01 ENCOUNTER — Other Ambulatory Visit: Payer: Self-pay

## 2021-05-01 ENCOUNTER — Encounter: Payer: Self-pay | Admitting: Nurse Practitioner

## 2021-05-01 ENCOUNTER — Inpatient Hospital Stay: Payer: Medicare HMO | Attending: Oncology | Admitting: Nurse Practitioner

## 2021-05-01 VITALS — BP 120/69 | HR 81 | Temp 96.9°F | Resp 16 | Ht 64.0 in | Wt 172.0 lb

## 2021-05-01 DIAGNOSIS — D0511 Intraductal carcinoma in situ of right breast: Secondary | ICD-10-CM | POA: Diagnosis not present

## 2021-05-01 DIAGNOSIS — Z79811 Long term (current) use of aromatase inhibitors: Secondary | ICD-10-CM | POA: Diagnosis not present

## 2021-05-01 DIAGNOSIS — Z5181 Encounter for therapeutic drug level monitoring: Secondary | ICD-10-CM

## 2021-05-01 DIAGNOSIS — Z86000 Personal history of in-situ neoplasm of breast: Secondary | ICD-10-CM

## 2021-05-01 DIAGNOSIS — Z08 Encounter for follow-up examination after completed treatment for malignant neoplasm: Secondary | ICD-10-CM

## 2021-05-01 DIAGNOSIS — Z923 Personal history of irradiation: Secondary | ICD-10-CM | POA: Diagnosis not present

## 2021-05-01 MED ORDER — ANASTROZOLE 1 MG PO TABS
1.0000 mg | ORAL_TABLET | Freq: Every day | ORAL | 1 refills | Status: DC
Start: 2021-05-01 — End: 2021-11-05

## 2021-05-01 NOTE — Progress Notes (Signed)
Hematology/Oncology Consult Note [facility]  [phone] [phone]  Patient Care Team: Maryland Pink, MD as PCP - General (Family Medicine)   Name of the patient: Susan Montgomery  [id number]  1940-04-16   Date of visit: 05/01/21  Diagnosis- right breast DCIS ER positive  Chief complaint/ Reason for visit- routine follow-up of right breast DCIS on Arimidex  Heme/Onc history: patient is a 81 year old female with a past medical history significant for osteopenia hypertension who recently underwent bilateral screening mammogram on 11/10/2017 which showed some suspicious calcifications in the right breast.  Diagnostic mammogram revealed faint linear calcifications measuring 2.2 x 0.4 x 0.6 cm.  No associated mass or distortion.  This was followed by a core biopsy which revealed high-grade DCIS with comedonecrosis.  Patient has already met with Dr. Peyton Najjar and surgery is planned on 12/29/2017.  Patient previously to birth control Premarin for 10 years her paternal great aunt had history of breast cancer in her 24s.  No other family history of breast ovarian pancreatic or colon cancer.  She has not had any prior abnormal mammograms or breast biopsies.  She otherwise feels well today and denies other complaints.  She attained menopause at the age of 7.  Age of first.  At 74 and first childbirth at the age of 29.  She is G3, P3 L3.   Final pathology showed high-grade DCIS with calcifications.Distance from closest anterior margin was less than 0.5 mm focally.  Extent of DCIS was at least 20 mm, grade 3.  She completed adjuvant radiation treatment and started Arimidex in December 2019.     Interval history- Patient returns to clinic for routine follow up and surveillance of high grade dcis, on arimidex. She continues calcium and vitamin d. No falls. Feels well. No breast complaints.   ECOG PS- 1 Pain scale- 0   Review of systems- Review of Systems   Constitutional:  Negative for chills, fever, malaise/fatigue and weight loss.  HENT:  Negative for congestion, ear discharge and nosebleeds.   Eyes:  Negative for blurred vision.  Respiratory:  Negative for cough, hemoptysis, sputum production, shortness of breath and wheezing.   Cardiovascular:  Negative for chest pain, palpitations, orthopnea and claudication.  Gastrointestinal:  Negative for abdominal pain, blood in stool, constipation, diarrhea, heartburn, melena, nausea and vomiting.  Genitourinary:  Negative for dysuria, flank pain, frequency, hematuria and urgency.  Musculoskeletal:  Negative for back pain, joint pain and myalgias.  Skin:  Negative for rash.  Neurological:  Negative for dizziness, tingling, focal weakness, seizures, weakness and headaches.  Endo/Heme/Allergies:  Does not bruise/bleed easily.  Psychiatric/Behavioral:  Negative for depression and suicidal ideas. The patient does not have insomnia.      Allergies  Allergen Reactions   Codeine Anaphylaxis   Penicillins Anaphylaxis   Sulfa Antibiotics Swelling     Past Medical History:  Diagnosis Date   Anxiety    Breast cancer ([place]) 12/2017   Cancer ([place])    skin   Diabetes ([place])    Diabetes mellitus without complication ([place])    Dyspnea    With walking fast or activity around the house   GERD (gastroesophageal reflux disease)    History of kidney stones    Hypertension    Personal history of radiation therapy      Past Surgical History:  Procedure Laterality Date   ABDOMINAL HYSTERECTOMY     ACHILLES TENDON REPAIR Right    BREAST BIOPSY Right 12/09/2017   -"coil"  clip High-grade comedo type DCIS   BREAST EXCISIONAL BIOPSY Right 12/29/2017   lumpectomy   BREAST LUMPECTOMY Right 12/29/2017   DUCTAL CARCINOMA IN SITU, HIGH-GRADE   COLONOSCOPY WITH PROPOFOL     EYE SURGERY     Cataract removal   PARTIAL MASTECTOMY WITH NEEDLE LOCALIZATION Right 12/29/2017   Procedure: PARTIAL MASTECTOMY WITH  NEEDLE LOCALIZATION;  Surgeon: Herbert Pun, MD;  Location: [facility] ORS;  Service: General;  Laterality: Right;   SKIN CANCER EXCISION     Basil Cell on the nose    Social History   Socioeconomic History   Marital status: Married    Spouse name: Not on file   Number of children: Not on file   Years of education: Not on file   Highest education level: Not on file  Occupational History   Not on file  Tobacco Use   Smoking status: Never   Smokeless tobacco: Never  Vaping Use   Vaping Use: Never used  Substance and Sexual Activity   Alcohol use: Never   Drug use: Never   Sexual activity: Not Currently  Other Topics Concern   Not on file  Social History Narrative   Not on file   Social Determinants of Health   Financial Resource Strain: Not on file  Food Insecurity: Not on file  Transportation Needs: Not on file  Physical Activity: Not on file  Stress: Not on file  Social Connections: Not on file  Intimate Partner Violence: Not on file    Family History  Problem Relation Age of Onset   Breast cancer Paternal Aunt 73   Diabetes Mother    Diabetes Sister     Current Outpatient Medications:    ACCU-CHEK GUIDE test strip, , Disp: , Rfl:    Accu-Chek Softclix Lancets lancets, , Disp: , Rfl:    acetaminophen (TYLENOL) 325 MG tablet, Take 650 mg by mouth every 6 (six) hours as needed., Disp: , Rfl:    anastrozole (ARIMIDEX) 1 MG tablet, Take 1 tablet by mouth once daily, Disp: 90 tablet, Rfl: 1   Calcium Carb-Cholecalciferol (CALCIUM-VITAMIN D) 500-400 MG-UNIT TABS, Take 1 tablet by mouth daily., Disp: , Rfl:    citalopram (CELEXA) 20 MG tablet, TAKE 1 TABLET BY MOUTH ONCE DAILY, Disp: , Rfl:    glipiZIDE (GLUCOTROL XL) 2.5 MG 24 hr tablet, Take 1 tablet by mouth daily., Disp: , Rfl:    latanoprost (XALATAN) 0.005 % ophthalmic solution, Place 1 drop into both eyes at bedtime. , Disp: , Rfl: 5   omeprazole (PRILOSEC) 20 MG capsule, Take 20 mg by mouth daily., Disp: ,  Rfl:    simvastatin (ZOCOR) 10 MG tablet, Take 10 mg by mouth daily. , Disp: , Rfl:    triamterene-hydrochlorothiazide (MAXZIDE) 75-50 MG tablet, Take 1 tablet by mouth daily., Disp: , Rfl: 3  Physical exam:  There were no vitals filed for this visit.  Physical Exam Constitutional:      General: She is not in acute distress. HENT:     Head: Normocephalic.  Pulmonary:     Effort: No respiratory distress.  Neurological:     Mental Status: She is alert and oriented to person, place, and time.  Psychiatric:        Mood and Affect: Mood normal.        Behavior: Behavior normal.  Breast exam was performed in seated and lying down position. Patient is status post right lumpectomy with a well-healed surgical scar. No evidence of any palpable  masses. No evidence of axillary adenopathy. No evidence of any palpable masses or lumps in the left breast. No evidence of left axillary adenopathy    CMP Latest Ref Rng & Units 04/26/2018  Glucose 70 - 99 mg/dL 210(H)  BUN 8 - 23 mg/dL 28(H)  Creatinine 0.44 - 1.00 mg/dL 1.09(H)  Sodium 135 - 145 mmol/L 138  Potassium 3.5 - 5.1 mmol/L 4.0  Chloride 98 - 111 mmol/L 101  CO2 22 - 32 mmol/L 27  Calcium 8.9 - 10.3 mg/dL 9.3  Total Protein 6.5 - 8.1 g/dL 7.6  Total Bilirubin 0.3 - 1.2 mg/dL 0.7  Alkaline Phos 38 - 126 U/L 60  AST 15 - 41 U/L 22  ALT 0 - 44 U/L 15   CBC Latest Ref Rng & Units 02/23/2018  WBC 4.0 - 10.5 K/uL 7.3  Hemoglobin 12.0 - 15.0 g/dL 13.0  Hematocrit 36.0 - 46.0 % 41.5  Platelets 150 - 400 K/uL 281     Assessment and plan- Patient is a 81 y.o. female with   Right breast DCIS - s/p partial mastectomy (high grade DCIS) and radiation in October 2019 followed by AI/arimidex given ER positivity. She will continue at least 5 years of adjuvant AI treatment. Tolerating well. Mammogram due in October.  Bone Health- bone density was t score - 0.7/normal in 02/19/20. She will continue calcium 1200 mg and vitamin d 1000 iu daily  along with weight bearing exercise as tolerated. Plan to repeat bone density in December 2023.   Given high grade DCIS and good performance status recommend she continue 6 month surveillance. After 5 years, transition to annual visits.   Disposition December 2023- bone density January 01, 2022- mammogram 6 month- See Dr. Janese Banks for surveillance - [place]   Visit Diagnosis 1. Encounter for follow-up surveillance of ductal carcinoma in situ (DCIS) of breast   2. Ductal carcinoma in situ (DCIS) of right breast   3. Visit for monitoring Arimidex therapy    Beckey Rutter, [place], AGNP-C [facility] at [facility] [phone] (clinic) 05/01/2021

## 2021-05-12 DIAGNOSIS — I10 Essential (primary) hypertension: Secondary | ICD-10-CM | POA: Diagnosis not present

## 2021-05-12 DIAGNOSIS — E119 Type 2 diabetes mellitus without complications: Secondary | ICD-10-CM | POA: Diagnosis not present

## 2021-05-15 ENCOUNTER — Ambulatory Visit: Payer: Medicare HMO | Admitting: Radiation Oncology

## 2021-05-19 DIAGNOSIS — R0602 Shortness of breath: Secondary | ICD-10-CM | POA: Diagnosis not present

## 2021-05-19 DIAGNOSIS — Z170 Estrogen receptor positive status [ER+]: Secondary | ICD-10-CM | POA: Diagnosis not present

## 2021-05-19 DIAGNOSIS — N1831 Chronic kidney disease, stage 3a: Secondary | ICD-10-CM | POA: Diagnosis not present

## 2021-05-19 DIAGNOSIS — Z0000 Encounter for general adult medical examination without abnormal findings: Secondary | ICD-10-CM | POA: Diagnosis not present

## 2021-05-19 DIAGNOSIS — E785 Hyperlipidemia, unspecified: Secondary | ICD-10-CM | POA: Diagnosis not present

## 2021-05-19 DIAGNOSIS — Z1389 Encounter for screening for other disorder: Secondary | ICD-10-CM | POA: Diagnosis not present

## 2021-05-19 DIAGNOSIS — E1122 Type 2 diabetes mellitus with diabetic chronic kidney disease: Secondary | ICD-10-CM | POA: Diagnosis not present

## 2021-05-19 DIAGNOSIS — C50512 Malignant neoplasm of lower-outer quadrant of left female breast: Secondary | ICD-10-CM | POA: Diagnosis not present

## 2021-05-19 DIAGNOSIS — I129 Hypertensive chronic kidney disease with stage 1 through stage 4 chronic kidney disease, or unspecified chronic kidney disease: Secondary | ICD-10-CM | POA: Diagnosis not present

## 2021-05-22 ENCOUNTER — Ambulatory Visit
Admission: RE | Admit: 2021-05-22 | Discharge: 2021-05-22 | Disposition: A | Discharge status: Home / Self Care | Payer: Medicare HMO | Source: Ambulatory Visit | Attending: Radiation Oncology | Admitting: Radiation Oncology

## 2021-05-22 ENCOUNTER — Encounter: Payer: Self-pay | Admitting: Radiation Oncology

## 2021-05-22 ENCOUNTER — Other Ambulatory Visit: Payer: Self-pay

## 2021-05-22 VITALS — BP 145/77 | HR 81 | Resp 18 | Ht 64.0 in | Wt 177.7 lb

## 2021-05-22 DIAGNOSIS — Z79811 Long term (current) use of aromatase inhibitors: Secondary | ICD-10-CM | POA: Diagnosis not present

## 2021-05-22 DIAGNOSIS — D0511 Intraductal carcinoma in situ of right breast: Secondary | ICD-10-CM | POA: Diagnosis not present

## 2021-05-22 DIAGNOSIS — Z923 Personal history of irradiation: Secondary | ICD-10-CM | POA: Diagnosis not present

## 2021-05-22 DIAGNOSIS — Z170 Estrogen receptor positive status [ER+]: Secondary | ICD-10-CM | POA: Insufficient documentation

## 2021-05-22 DIAGNOSIS — Z86000 Personal history of in-situ neoplasm of breast: Secondary | ICD-10-CM | POA: Diagnosis not present

## 2021-05-22 DIAGNOSIS — C50512 Malignant neoplasm of lower-outer quadrant of left female breast: Secondary | ICD-10-CM

## 2021-05-22 DIAGNOSIS — Z08 Encounter for follow-up examination after completed treatment for malignant neoplasm: Secondary | ICD-10-CM | POA: Diagnosis not present

## 2021-05-22 NOTE — Progress Notes (Signed)
Radiation Oncology ?Follow up Note ? ?Name: Susan Montgomery   ?Date:   05/22/2021 ?MRN:  [id number] ?DOB: Mar 02, 1941  ? ? ?This 81 y.o. female presents to the clinic today for 3-year follow-up status post whole breast radiation to her right breast for ER/PR positive ductal carcinoma in situ. ? ?REFERRING PROVIDER: Maryland Pink, MD ? ?HPI: Patient is an 81 year old female now out over 3 years having completed whole breast radiation to her right breast for ER/PR positive ductal carcinoma in situ.  Seen today in routine follow-up she is doing well.  She specifically denies breast tenderness cough or bone pain..  Mammograms back in October which I have reviewed were BI-RADS 1 negative.  She is currently on Arimidex tolerating it well without side effect. ? ?COMPLICATIONS OF TREATMENT: none ? ?FOLLOW UP COMPLIANCE: keeps appointments  ? ?PHYSICAL EXAM:  ?BP (!) 145/77 (BP Location: Left Arm, Patient Position: Sitting)   Pulse 81   Resp 18   Ht '5\' 4"'$  (1.626 m)   Wt 177 lb 11.2 oz (80.6 kg)   BMI 30.50 kg/m?  ?Lungs are clear to A&P cardiac examination essentially unremarkable with regular rate and rhythm. No dominant mass or nodularity is noted in either breast in 2 positions examined. Incision is well-healed. No axillary or supraclavicular adenopathy is appreciated. Cosmetic result is excellent.  Well-developed well-nourished patient in NAD. HEENT reveals PERLA, EOMI, discs not visualized.  Oral cavity is clear. No oral mucosal lesions are identified. Neck is clear without evidence of cervical or supraclavicular adenopathy. Lungs are clear to A&P. Cardiac examination is essentially unremarkable with regular rate and rhythm without murmur rub or thrill. Abdomen is benign with no organomegaly or masses noted. Motor sensory and DTR levels are equal and symmetric in the upper and lower extremities. Cranial nerves II through XII are grossly intact. Proprioception is intact. No peripheral adenopathy or edema is identified.  No motor or sensory levels are noted. Crude visual fields are within normal range. ? ?RADIOLOGY RESULTS: Mammograms reviewed compatible with above-stated findings ? ?PLAN: Present time patient continues to do well out over 3 years from whole breast radiation with no evidence of disease.  Based that is ductal carcinoma in situ her age and mobility I will see her 1 more time and then discontinue follow-up care.  Patient knows to call with any concerns. ? ?I would like to take this opportunity to thank you for allowing me to participate in the care of your patient.. ?  ? Noreene Filbert, MD ? ?

## 2021-06-30 DIAGNOSIS — H401131 Primary open-angle glaucoma, bilateral, mild stage: Secondary | ICD-10-CM | POA: Diagnosis not present

## 2021-10-26 ENCOUNTER — Other Ambulatory Visit: Payer: Self-pay | Admitting: Nurse Practitioner

## 2021-10-30 DIAGNOSIS — Z85828 Personal history of other malignant neoplasm of skin: Secondary | ICD-10-CM | POA: Diagnosis not present

## 2021-10-30 DIAGNOSIS — D2262 Melanocytic nevi of left upper limb, including shoulder: Secondary | ICD-10-CM | POA: Diagnosis not present

## 2021-10-30 DIAGNOSIS — C44519 Basal cell carcinoma of skin of other part of trunk: Secondary | ICD-10-CM | POA: Diagnosis not present

## 2021-10-30 DIAGNOSIS — D2272 Melanocytic nevi of left lower limb, including hip: Secondary | ICD-10-CM | POA: Diagnosis not present

## 2021-10-30 DIAGNOSIS — D2271 Melanocytic nevi of right lower limb, including hip: Secondary | ICD-10-CM | POA: Diagnosis not present

## 2021-10-30 DIAGNOSIS — D485 Neoplasm of uncertain behavior of skin: Secondary | ICD-10-CM | POA: Diagnosis not present

## 2021-10-30 DIAGNOSIS — X32XXXA Exposure to sunlight, initial encounter: Secondary | ICD-10-CM | POA: Diagnosis not present

## 2021-10-30 DIAGNOSIS — L570 Actinic keratosis: Secondary | ICD-10-CM | POA: Diagnosis not present

## 2021-10-31 ENCOUNTER — Ambulatory Visit: Payer: Medicare HMO | Admitting: Oncology

## 2021-11-05 ENCOUNTER — Inpatient Hospital Stay: Payer: Medicare HMO | Attending: Oncology | Admitting: Oncology

## 2021-11-05 ENCOUNTER — Encounter: Payer: Self-pay | Admitting: Oncology

## 2021-11-05 VITALS — BP 131/80 | HR 71 | Temp 98.8°F | Resp 18 | Wt 174.6 lb

## 2021-11-05 DIAGNOSIS — Z5181 Encounter for therapeutic drug level monitoring: Secondary | ICD-10-CM | POA: Diagnosis not present

## 2021-11-05 DIAGNOSIS — D0511 Intraductal carcinoma in situ of right breast: Secondary | ICD-10-CM | POA: Insufficient documentation

## 2021-11-05 DIAGNOSIS — Z170 Estrogen receptor positive status [ER+]: Secondary | ICD-10-CM | POA: Insufficient documentation

## 2021-11-05 DIAGNOSIS — Z79811 Long term (current) use of aromatase inhibitors: Secondary | ICD-10-CM | POA: Insufficient documentation

## 2021-11-05 DIAGNOSIS — E119 Type 2 diabetes mellitus without complications: Secondary | ICD-10-CM | POA: Insufficient documentation

## 2021-11-05 MED ORDER — ANASTROZOLE 1 MG PO TABS: 1.0000 mg | ORAL_TABLET | Freq: Every day | ORAL | 1 refills | Status: DC

## 2021-11-05 NOTE — Progress Notes (Signed)
Hematology/Oncology Consult note [facility]  Telephone:([phone] [phone]  Patient Care Team: [place] Pink, MD as PCP - General (Family Medicine)   Name of the patient: Susan Montgomery  [id number]  1940/05/30   Date of visit: 11/05/21  Diagnosis-right breast DCIS ER positive  Chief complaint/ Reason for visit-routine follow-up of breast cancer  Heme/Onc history: patient is a 81 year old female with a past medical history significant for osteopenia hypertension who recently underwent bilateral screening mammogram on 11/10/2017 which showed some suspicious calcifications in the right breast.  Diagnostic mammogram revealed faint linear calcifications measuring 2.2 x 0.4 x 0.6 cm.  No associated mass or distortion.  This was followed by a core biopsy which revealed high-grade DCIS with comedonecrosis.  Patient has already met with Dr. Peyton Najjar and surgery is planned on 12/29/2017.  Patient previously to birth control Premarin for 10 years her paternal great aunt had history of breast cancer in her 42s.  No other family history of breast ovarian pancreatic or colon cancer.  She has not had any prior abnormal mammograms or breast biopsies.  She otherwise feels well today and denies other complaints.  She attained menopause at the age of 39.  Age of first.  At 89 and first childbirth at the age of 23.  She is G3, P3 L3.   Final pathology showed high-grade DCIS with calcifications.Distance from closest anterior margin was less than 0.5 mm focally.  Extent of DCIS was at least 20 mm, grade 3.  She completed adjuvant radiation treatment and started Arimidex in December 2019.  Interval history-patient is doing well for her age.  She has not had any falls.  She is tolerating Arimidex well without any significant side effects.  Denies any breast concerns  ECOG PS- 1 Pain scale- 0   Review of systems- Review of Systems  Constitutional:  Negative for chills, fever,  malaise/fatigue and weight loss.  HENT:  Negative for congestion, ear discharge and nosebleeds.   Eyes:  Negative for blurred vision.  Respiratory:  Negative for cough, hemoptysis, sputum production, shortness of breath and wheezing.   Cardiovascular:  Negative for chest pain, palpitations, orthopnea and claudication.  Gastrointestinal:  Negative for abdominal pain, blood in stool, constipation, diarrhea, heartburn, melena, nausea and vomiting.  Genitourinary:  Negative for dysuria, flank pain, frequency, hematuria and urgency.  Musculoskeletal:  Negative for back pain, joint pain and myalgias.  Skin:  Negative for rash.  Neurological:  Negative for dizziness, tingling, focal weakness, seizures, weakness and headaches.  Endo/Heme/Allergies:  Does not bruise/bleed easily.  Psychiatric/Behavioral:  Negative for depression and suicidal ideas. The patient does not have insomnia.       Allergies  Allergen Reactions   Codeine Anaphylaxis   Penicillins Anaphylaxis   Sulfa Antibiotics Swelling     Past Medical History:  Diagnosis Date   Anxiety    Breast cancer ([place]) 12/2017   Cancer ([place])    skin   Diabetes ([place])    Diabetes mellitus without complication ([place])    Dyspnea    With walking fast or activity around the house   GERD (gastroesophageal reflux disease)    History of kidney stones    Hypertension    Personal history of radiation therapy      Past Surgical History:  Procedure Laterality Date   ABDOMINAL HYSTERECTOMY     ACHILLES TENDON REPAIR Right    BREAST BIOPSY Right 12/09/2017   -"coil"  clip High-grade comedo type DCIS  BREAST EXCISIONAL BIOPSY Right 12/29/2017   lumpectomy   BREAST LUMPECTOMY Right 12/29/2017   DUCTAL CARCINOMA IN SITU, HIGH-GRADE   COLONOSCOPY WITH PROPOFOL     EYE SURGERY     Cataract removal   PARTIAL MASTECTOMY WITH NEEDLE LOCALIZATION Right 12/29/2017   Procedure: PARTIAL MASTECTOMY WITH NEEDLE LOCALIZATION;  Surgeon: Herbert Pun, MD;  Location: [facility] ORS;  Service: General;  Laterality: Right;   SKIN CANCER EXCISION     Basil Cell on the nose    Social History   Socioeconomic History   Marital status: Married    Spouse name: Not on file   Number of children: Not on file   Years of education: Not on file   Highest education level: Not on file  Occupational History   Not on file  Tobacco Use   Smoking status: Never   Smokeless tobacco: Never  Vaping Use   Vaping Use: Never used  Substance and Sexual Activity   Alcohol use: Never   Drug use: Never   Sexual activity: Not Currently  Other Topics Concern   Not on file  Social History Narrative   Not on file   Social Determinants of Health   Financial Resource Strain: Not on file  Food Insecurity: Not on file  Transportation Needs: Not on file  Physical Activity: Not on file  Stress: Not on file  Social Connections: Not on file  Intimate Partner Violence: Not on file    Family History  Problem Relation Age of Onset   Breast cancer Paternal Aunt 73   Diabetes Mother    Diabetes Sister      Current Outpatient Medications:    ACCU-CHEK GUIDE test strip, , Disp: , Rfl:    Accu-Chek Softclix Lancets lancets, , Disp: , Rfl:    acetaminophen (TYLENOL) 325 MG tablet, Take 650 mg by mouth every 6 (six) hours as needed., Disp: , Rfl:    Calcium Carb-Cholecalciferol (CALCIUM-VITAMIN D) 500-400 MG-UNIT TABS, Take 1 tablet by mouth daily., Disp: , Rfl:    citalopram (CELEXA) 20 MG tablet, TAKE 1 TABLET BY MOUTH ONCE DAILY, Disp: , Rfl:    glipiZIDE (GLUCOTROL XL) 2.5 MG 24 hr tablet, Take 1 tablet by mouth daily., Disp: , Rfl:    latanoprost (XALATAN) 0.005 % ophthalmic solution, Place 1 drop into both eyes at bedtime. , Disp: , Rfl: 5   omeprazole (PRILOSEC) 20 MG capsule, Take 20 mg by mouth daily., Disp: , Rfl:    simvastatin (ZOCOR) 10 MG tablet, Take 10 mg by mouth daily. , Disp: , Rfl:    triamterene-hydrochlorothiazide (MAXZIDE) 75-50 MG  tablet, Take 1 tablet by mouth daily., Disp: , Rfl: 3   anastrozole (ARIMIDEX) 1 MG tablet, Take 1 tablet (1 mg total) by mouth daily., Disp: 90 tablet, Rfl: 1  Physical exam:  Vitals:   11/05/21 1039  BP: 131/80  Pulse: 71  Resp: 18  Temp: 98.8 F (37.1 C)  SpO2: 97%  Weight: 174 lb 9.6 oz (79.2 kg)   Physical Exam Constitutional:      General: She is not in acute distress. Cardiovascular:     Rate and Rhythm: Normal rate and regular rhythm.     Heart sounds: Normal heart sounds.  Pulmonary:     Effort: Pulmonary effort is normal.     Breath sounds: Normal breath sounds.  Abdominal:     General: Bowel sounds are normal.     Palpations: Abdomen is soft.  Skin:  General: Skin is warm and dry.  Neurological:     Mental Status: She is alert and oriented to person, place, and time.    Breast exam was performed in seated and lying down position. Patient is status post right lumpectomy with a well-healed surgical scar. No evidence of any palpable masses. No evidence of axillary adenopathy. No evidence of any palpable masses or lumps in the left breast. No evidence of leftt axillary adenopathy      Latest Ref Rng & Units 04/26/2018   11:09 AM  CMP  Glucose 70 - 99 mg/dL 210   BUN 8 - 23 mg/dL 28   Creatinine 0.44 - 1.00 mg/dL 1.09   Sodium 135 - 145 mmol/L 138   Potassium 3.5 - 5.1 mmol/L 4.0   Chloride 98 - 111 mmol/L 101   CO2 22 - 32 mmol/L 27   Calcium 8.9 - 10.3 mg/dL 9.3   Total Protein 6.5 - 8.1 g/dL 7.6   Total Bilirubin 0.3 - 1.2 mg/dL 0.7   Alkaline Phos 38 - 126 U/L 60   AST 15 - 41 U/L 22   ALT 0 - 44 U/L 15       Latest Ref Rng & Units 02/23/2018   10:02 AM  CBC  WBC 4.0 - 10.5 K/uL 7.3   Hemoglobin 12.0 - 15.0 g/dL 13.0   Hematocrit 36.0 - 46.0 % 41.5   Platelets 150 - 400 K/uL 281     Assessment and plan- Patient is a 81 y.o. female with history of right breast DCIS currently on Arimidex and this is a routine follow-up visit  Patient will  complete 5 years of endocrine therapy in December 2024.  Presently she is toleratingArimidex well without anything begin side effects.  She will need a routine screening mammogram in October 2023 as well as a bone density scan in December 2023 which I will schedule.  Her baseline bone density scan was normal   Visit Diagnosis 1. Ductal carcinoma in situ (DCIS) of right breast   2. Visit for monitoring Arimidex therapy      Dr. Randa Evens, MD, MPH [facility] [phone] 11/05/2021 3:40 PM

## 2021-11-19 DIAGNOSIS — E119 Type 2 diabetes mellitus without complications: Secondary | ICD-10-CM | POA: Diagnosis not present

## 2021-11-19 DIAGNOSIS — R002 Palpitations: Secondary | ICD-10-CM | POA: Diagnosis not present

## 2021-11-19 DIAGNOSIS — I10 Essential (primary) hypertension: Secondary | ICD-10-CM | POA: Diagnosis not present

## 2021-11-19 DIAGNOSIS — M8580 Other specified disorders of bone density and structure, unspecified site: Secondary | ICD-10-CM | POA: Diagnosis not present

## 2021-11-19 DIAGNOSIS — R0602 Shortness of breath: Secondary | ICD-10-CM | POA: Diagnosis not present

## 2021-11-24 DIAGNOSIS — R0602 Shortness of breath: Secondary | ICD-10-CM | POA: Diagnosis not present

## 2021-11-24 DIAGNOSIS — I10 Essential (primary) hypertension: Secondary | ICD-10-CM | POA: Diagnosis not present

## 2021-11-24 DIAGNOSIS — R002 Palpitations: Secondary | ICD-10-CM | POA: Diagnosis not present

## 2021-12-08 DIAGNOSIS — R002 Palpitations: Secondary | ICD-10-CM | POA: Diagnosis not present

## 2021-12-08 DIAGNOSIS — R0602 Shortness of breath: Secondary | ICD-10-CM | POA: Diagnosis not present

## 2021-12-23 DIAGNOSIS — C44519 Basal cell carcinoma of skin of other part of trunk: Secondary | ICD-10-CM | POA: Diagnosis not present

## 2021-12-30 DIAGNOSIS — E119 Type 2 diabetes mellitus without complications: Secondary | ICD-10-CM | POA: Diagnosis not present

## 2021-12-30 DIAGNOSIS — Z0100 Encounter for examination of eyes and vision without abnormal findings: Secondary | ICD-10-CM | POA: Diagnosis not present

## 2021-12-30 DIAGNOSIS — H401131 Primary open-angle glaucoma, bilateral, mild stage: Secondary | ICD-10-CM | POA: Diagnosis not present

## 2022-01-05 DIAGNOSIS — E119 Type 2 diabetes mellitus without complications: Secondary | ICD-10-CM | POA: Diagnosis not present

## 2022-01-05 DIAGNOSIS — R0609 Other forms of dyspnea: Secondary | ICD-10-CM | POA: Diagnosis not present

## 2022-01-05 DIAGNOSIS — I2089 Other forms of angina pectoris: Secondary | ICD-10-CM | POA: Diagnosis not present

## 2022-01-05 DIAGNOSIS — I10 Essential (primary) hypertension: Secondary | ICD-10-CM | POA: Diagnosis not present

## 2022-01-12 ENCOUNTER — Ambulatory Visit
Admission: RE | Admit: 2022-01-12 | Discharge: 2022-01-12 | Disposition: A | Discharge status: Home / Self Care | Payer: Medicare HMO | Source: Ambulatory Visit | Attending: Oncology | Admitting: Oncology

## 2022-01-12 DIAGNOSIS — D0511 Intraductal carcinoma in situ of right breast: Secondary | ICD-10-CM | POA: Diagnosis not present

## 2022-01-12 DIAGNOSIS — Z1231 Encounter for screening mammogram for malignant neoplasm of breast: Secondary | ICD-10-CM | POA: Insufficient documentation

## 2022-02-19 ENCOUNTER — Other Ambulatory Visit: Payer: Medicare HMO

## 2022-04-21 ENCOUNTER — Ambulatory Visit
Admission: RE | Admit: 2022-04-21 | Discharge: 2022-04-21 | Disposition: A | Discharge status: Home / Self Care | Payer: Medicare HMO | Source: Ambulatory Visit | Attending: Oncology | Admitting: Oncology

## 2022-04-21 DIAGNOSIS — M85851 Other specified disorders of bone density and structure, right thigh: Secondary | ICD-10-CM | POA: Insufficient documentation

## 2022-04-21 DIAGNOSIS — E119 Type 2 diabetes mellitus without complications: Secondary | ICD-10-CM | POA: Insufficient documentation

## 2022-04-21 DIAGNOSIS — Z780 Asymptomatic menopausal state: Secondary | ICD-10-CM | POA: Diagnosis not present

## 2022-04-21 DIAGNOSIS — Z923 Personal history of irradiation: Secondary | ICD-10-CM | POA: Insufficient documentation

## 2022-04-21 DIAGNOSIS — Z853 Personal history of malignant neoplasm of breast: Secondary | ICD-10-CM | POA: Insufficient documentation

## 2022-04-21 DIAGNOSIS — Z13820 Encounter for screening for osteoporosis: Secondary | ICD-10-CM | POA: Insufficient documentation

## 2022-04-21 DIAGNOSIS — D0511 Intraductal carcinoma in situ of right breast: Secondary | ICD-10-CM | POA: Insufficient documentation

## 2022-04-21 DIAGNOSIS — M8588 Other specified disorders of bone density and structure, other site: Secondary | ICD-10-CM | POA: Diagnosis not present

## 2022-04-28 DIAGNOSIS — D2272 Melanocytic nevi of left lower limb, including hip: Secondary | ICD-10-CM | POA: Diagnosis not present

## 2022-04-28 DIAGNOSIS — D225 Melanocytic nevi of trunk: Secondary | ICD-10-CM | POA: Diagnosis not present

## 2022-04-28 DIAGNOSIS — L821 Other seborrheic keratosis: Secondary | ICD-10-CM | POA: Diagnosis not present

## 2022-04-28 DIAGNOSIS — Z85828 Personal history of other malignant neoplasm of skin: Secondary | ICD-10-CM | POA: Diagnosis not present

## 2022-04-28 DIAGNOSIS — D2262 Melanocytic nevi of left upper limb, including shoulder: Secondary | ICD-10-CM | POA: Diagnosis not present

## 2022-04-28 DIAGNOSIS — D2271 Melanocytic nevi of right lower limb, including hip: Secondary | ICD-10-CM | POA: Diagnosis not present

## 2022-04-28 DIAGNOSIS — D2261 Melanocytic nevi of right upper limb, including shoulder: Secondary | ICD-10-CM | POA: Diagnosis not present

## 2022-04-28 DIAGNOSIS — L820 Inflamed seborrheic keratosis: Secondary | ICD-10-CM | POA: Diagnosis not present

## 2022-04-28 DIAGNOSIS — Z08 Encounter for follow-up examination after completed treatment for malignant neoplasm: Secondary | ICD-10-CM | POA: Diagnosis not present

## 2022-05-05 ENCOUNTER — Other Ambulatory Visit: Payer: Self-pay | Admitting: Oncology

## 2022-05-11 ENCOUNTER — Inpatient Hospital Stay: Payer: Medicare HMO | Attending: Oncology | Admitting: Oncology

## 2022-05-11 ENCOUNTER — Encounter: Payer: Self-pay | Admitting: Oncology

## 2022-05-11 VITALS — BP 138/76 | HR 77 | Temp 96.9°F | Resp 18 | Ht 63.0 in | Wt 169.2 lb

## 2022-05-11 DIAGNOSIS — M85851 Other specified disorders of bone density and structure, right thigh: Secondary | ICD-10-CM | POA: Diagnosis not present

## 2022-05-11 DIAGNOSIS — Z79899 Other long term (current) drug therapy: Secondary | ICD-10-CM | POA: Insufficient documentation

## 2022-05-11 DIAGNOSIS — Z79811 Long term (current) use of aromatase inhibitors: Secondary | ICD-10-CM | POA: Diagnosis not present

## 2022-05-11 DIAGNOSIS — D0511 Intraductal carcinoma in situ of right breast: Secondary | ICD-10-CM | POA: Insufficient documentation

## 2022-05-11 DIAGNOSIS — Z86000 Personal history of in-situ neoplasm of breast: Secondary | ICD-10-CM

## 2022-05-11 DIAGNOSIS — Z5181 Encounter for therapeutic drug level monitoring: Secondary | ICD-10-CM | POA: Diagnosis not present

## 2022-05-11 DIAGNOSIS — Z08 Encounter for follow-up examination after completed treatment for malignant neoplasm: Secondary | ICD-10-CM

## 2022-05-11 NOTE — Progress Notes (Signed)
No concerns today for the provider.

## 2022-05-11 NOTE — Progress Notes (Signed)
Hematology/Oncology Consult note [facility]  Telephone:([phone] [phone]  Patient Care Team: Maryland Pink, MD as PCP - General (Family Medicine)   Name of the patient: Susan Montgomery  [id number]  Sep 13, 1940   Date of visit: 05/11/22  Diagnosis-right breast DCIS ER positive  Chief complaint/ Reason for visit-routine follow-up of DCIS  Heme/Onc history: patient is a 82 year old female with a past medical history significant for osteopenia hypertension who recently underwent bilateral screening mammogram on 11/10/2017 which showed some suspicious calcifications in the right breast.  Diagnostic mammogram revealed faint linear calcifications measuring 2.2 x 0.4 x 0.6 cm.  No associated mass or distortion.  This was followed by a core biopsy which revealed high-grade DCIS with comedonecrosis.  Patient has already met with Dr. Peyton Najjar and surgery is planned on 12/29/2017.  Patient previously to birth control Premarin for 10 years her paternal great aunt had history of breast cancer in her 13s.  No other family history of breast ovarian pancreatic or colon cancer.  She has not had any prior abnormal mammograms or breast biopsies.  She otherwise feels well today and denies other complaints.  She attained menopause at the age of 62.  Age of first.  At 77 and first childbirth at the age of 58.  She is G3, P3 L3.   Final pathology showed high-grade DCIS with calcifications.Distance from closest anterior margin was less than 0.5 mm focally.  Extent of DCIS was at least 20 mm, grade 3.  She completed adjuvant radiation treatment and started Arimidex in December 2019.  Interval history-patient is tolerating Arimidex well without any significant side effects.  She is also compliant with her calcium and vitamin D.  Denies any specific breast concerns at this time  ECOG PS- 1 Pain scale- 0   Review of systems- Review of Systems  Constitutional:  Negative for chills,  fever, malaise/fatigue and weight loss.  HENT:  Negative for congestion, ear discharge and nosebleeds.   Eyes:  Negative for blurred vision.  Respiratory:  Negative for cough, hemoptysis, sputum production, shortness of breath and wheezing.   Cardiovascular:  Negative for chest pain, palpitations, orthopnea and claudication.  Gastrointestinal:  Negative for abdominal pain, blood in stool, constipation, diarrhea, heartburn, melena, nausea and vomiting.  Genitourinary:  Negative for dysuria, flank pain, frequency, hematuria and urgency.  Musculoskeletal:  Negative for back pain, joint pain and myalgias.  Skin:  Negative for rash.  Neurological:  Negative for dizziness, tingling, focal weakness, seizures, weakness and headaches.  Endo/Heme/Allergies:  Does not bruise/bleed easily.  Psychiatric/Behavioral:  Negative for depression and suicidal ideas. The patient does not have insomnia.     Breast exam was performed in seated and lying down position. Patient is status post right lumpectomy with a well-healed surgical scar. No evidence of any palpable masses. No evidence of axillary adenopathy. No evidence of any palpable masses or lumps in the left breast. No evidence of leftt axillary adenopathy   Allergies  Allergen Reactions   Codeine Anaphylaxis   Penicillins Anaphylaxis   Sulfa Antibiotics Swelling     Past Medical History:  Diagnosis Date   Anxiety    Breast cancer ([place]) 12/2017   Cancer ([place])    skin   Diabetes ([place])    Diabetes mellitus without complication ([place])    Dyspnea    With walking fast or activity around the house   GERD (gastroesophageal reflux disease)    History of kidney stones    Hypertension  Personal history of radiation therapy      Past Surgical History:  Procedure Laterality Date   ABDOMINAL HYSTERECTOMY     ACHILLES TENDON REPAIR Right    BREAST BIOPSY Right 12/09/2017   -"coil"  clip High-grade comedo type DCIS   BREAST EXCISIONAL BIOPSY Right  12/29/2017   lumpectomy   BREAST LUMPECTOMY Right 12/29/2017   DUCTAL CARCINOMA IN SITU, HIGH-GRADE   COLONOSCOPY WITH PROPOFOL     EYE SURGERY     Cataract removal   PARTIAL MASTECTOMY WITH NEEDLE LOCALIZATION Right 12/29/2017   Procedure: PARTIAL MASTECTOMY WITH NEEDLE LOCALIZATION;  Surgeon: Herbert Pun, MD;  Location: [facility] ORS;  Service: General;  Laterality: Right;   SKIN CANCER EXCISION     Basil Cell on the nose    Social History   Socioeconomic History   Marital status: Married    Spouse name: Not on file   Number of children: Not on file   Years of education: Not on file   Highest education level: Not on file  Occupational History   Not on file  Tobacco Use   Smoking status: Never   Smokeless tobacco: Never  Vaping Use   Vaping Use: Never used  Substance and Sexual Activity   Alcohol use: Never   Drug use: Never   Sexual activity: Not Currently  Other Topics Concern   Not on file  Social History Narrative   Not on file   Social Determinants of Health   Financial Resource Strain: Not on file  Food Insecurity: Not on file  Transportation Needs: Not on file  Physical Activity: Not on file  Stress: Not on file  Social Connections: Not on file  Intimate Partner Violence: Not on file    Family History  Problem Relation Age of Onset   Breast cancer Paternal Aunt 73   Diabetes Mother    Diabetes Sister      Current Outpatient Medications:    ACCU-CHEK GUIDE test strip, , Disp: , Rfl:    Accu-Chek Softclix Lancets lancets, , Disp: , Rfl:    acetaminophen (TYLENOL) 325 MG tablet, Take 650 mg by mouth every 6 (six) hours as needed., Disp: , Rfl:    anastrozole (ARIMIDEX) 1 MG tablet, Take 1 tablet by mouth once daily, Disp: 90 tablet, Rfl: 0   Calcium Carb-Cholecalciferol (CALCIUM-VITAMIN D) 500-400 MG-UNIT TABS, Take 1 tablet by mouth daily., Disp: , Rfl:    citalopram (CELEXA) 20 MG tablet, TAKE 1 TABLET BY MOUTH ONCE DAILY, Disp: , Rfl:     glipiZIDE (GLUCOTROL XL) 2.5 MG 24 hr tablet, Take 1 tablet by mouth daily., Disp: , Rfl:    latanoprost (XALATAN) 0.005 % ophthalmic solution, Place 1 drop into both eyes at bedtime. , Disp: , Rfl: 5   omeprazole (PRILOSEC) 20 MG capsule, Take 20 mg by mouth daily., Disp: , Rfl:    timolol (TIMOPTIC) 0.5 % ophthalmic solution, 1 drop daily., Disp: , Rfl:    triamterene-hydrochlorothiazide (MAXZIDE) 75-50 MG tablet, Take 1 tablet by mouth daily., Disp: , Rfl: 3   simvastatin (ZOCOR) 10 MG tablet, Take 10 mg by mouth daily. , Disp: , Rfl:   Physical exam:  Vitals:   05/11/22 1119  BP: 138/76  Pulse: 77  Resp: 18  Temp: (!) 96.9 F (36.1 C)  TempSrc: Tympanic  SpO2: 99%  Weight: 169 lb 3.2 oz (76.7 kg)  Height: '5\' 3"'$  (1.6 m)   Physical Exam Cardiovascular:     Rate and Rhythm: Normal  rate and regular rhythm.     Heart sounds: Normal heart sounds.  Pulmonary:     Effort: Pulmonary effort is normal.     Breath sounds: Normal breath sounds.  Abdominal:     General: Bowel sounds are normal.     Palpations: Abdomen is soft.  Skin:    General: Skin is warm and dry.  Neurological:     Mental Status: She is alert and oriented to person, place, and time.        Latest Ref Rng & Units 04/26/2018   11:09 AM  CMP  Glucose 70 - 99 mg/dL 210   BUN 8 - 23 mg/dL 28   Creatinine 0.44 - 1.00 mg/dL 1.09   Sodium 135 - 145 mmol/L 138   Potassium 3.5 - 5.1 mmol/L 4.0   Chloride 98 - 111 mmol/L 101   CO2 22 - 32 mmol/L 27   Calcium 8.9 - 10.3 mg/dL 9.3   Total Protein 6.5 - 8.1 g/dL 7.6   Total Bilirubin 0.3 - 1.2 mg/dL 0.7   Alkaline Phos 38 - 126 U/L 60   AST 15 - 41 U/L 22   ALT 0 - 44 U/L 15       Latest Ref Rng & Units 02/23/2018   10:02 AM  CBC  WBC 4.0 - 10.5 K/uL 7.3   Hemoglobin 12.0 - 15.0 g/dL 13.0   Hematocrit 36.0 - 46.0 % 41.5   Platelets 150 - 400 K/uL 281     No images are attached to the encounter.  DG Bone Density  Result Date: 04/21/2022 EXAM: DUAL X-RAY  ABSORPTIOMETRY (DXA) FOR BONE MINERAL DENSITY IMPRESSION: Your patient Susan Montgomery completed a BMD test on 04/21/2022 using the [place] (software version: 14.10) manufactured by [employer]. The following summarizes the results of our evaluation. Technologist:VLM PATIENT BIOGRAPHICAL: Name: Susan Montgomery, Susan Montgomery Patient ID: [id number] Birth Date: 12-30-1940 Height: 64.0 in. Gender: Female Exam Date: 04/21/2022 Weight: 174.6 lbs. Indications: Caucasian, Diabetic, Hysterectomy, Postmenopausal, History of Breast Cancer, History of Radiation, Advanced Age Fractures: Treatments: Anastrozole, calcium w/ vit D DENSITOMETRY RESULTS: Site      Region     Measured Date Measured Age WHO Classification Young Adult T-score BMD         %Change vs. Previous Significant Change (*) AP Spine L1-L4 4/21/2022 82.0 Normal -0.3 1.159 g/cm2 4.4% Yes AP Spine L1-L4 02/19/2020 79.9 Normal -0.7 1.110 g/cm2 3.5% Yes AP Spine L1-L4 02/16/2018 77.9 Normal -1.0 1.072 g/cm2 - - DualFemur Neck Right 04/21/2022 82.0 Osteopenia -1.3 0.863 g/cm2 -8.9% Yes DualFemur Neck Right 02/19/2020 79.9 Normal -0.7 0.947 g/cm2 0.7% - DualFemur Neck Right 02/16/2018 77.9 Normal -0.7 0.940 g/cm2 - - DualFemur Total Mean 04/21/2022 82.0 Normal -0.3 0.976 g/cm2 -5.5% Yes DualFemur Total Mean 02/19/2020 79.9 Normal 0.2 1.033 g/cm2 -1.1% - DualFemur Total Mean 02/16/2018 77.9 Normal 0.3 1.044 g/cm2 - - ASSESSMENT: The BMD measured at Femur Neck Right is 0.863 g/cm2 with a T-score of -1.3. This patient is considered osteopenic according to [facility]) criteria. Compared with prior study, there has been significant increase in the spine. Compared with prior study, there has been significant decrease in the total hip. The scan quality is good. World Health Organization [facility]) criteria for post-menopausal, Caucasian Women: Normal:                   T-score at or above -1 SD Osteopenia/low bone mass: T-score between -1 and -2.5 SD  Osteoporosis:             T-score at or below -2.5 SD RECOMMENDATIONS: 1. All patients should optimize calcium and vitamin D intake. 2. Consider FDA-approved medical therapies in postmenopausal women and men aged 86 years and older, based on the following: a. A hip or vertebral(clinical or morphometric) fracture b. T-score < -2.5 at the femoral neck or spine after appropriate evaluation to exclude secondary causes c. Low bone mass (T-score between -1.0 and -2.5 at the femoral neck or spine) and a 10-year probability of a hip fracture > 3% or a 10-year probability of a major osteoporosis-related fracture > 20% based on the [place]-adapted WHO algorithm 3. Clinician judgment and/or patient preferences may indicate treatment for people with 10-year fracture probabilities above or below these levels FOLLOW-UP: People with diagnosed cases of osteoporosis or at high risk for fracture should have regular bone mineral density tests. For patients eligible for Medicare, routine testing is allowed once every 2 years. The testing frequency can be increased to one year for patients who have rapidly progressing disease, those who are receiving or discontinuing medical therapy to restore bone mass, or have additional risk factors. I have reviewed this report, and agree with the above findings. [facility] Susan Susan Montgomery, Your patient Susan Montgomery completed a FRAX assessment on 04/21/2022 using the [place] (analysis version: 14.10) manufactured by [employer]. The following summarizes the results of our evaluation. PATIENT BIOGRAPHICAL: Name: Susan Montgomery, Susan Montgomery Patient ID: [id number] Birth Date: 1940-06-28 Height:    64.0 in. Gender:     Female    [age]       Weight:    174.6 lbs. Ethnicity:  White                            Exam Date: 04/21/2022 FRAX* RESULTS:  (version: 3.5) 10-year Probability of Fracture1 Major Osteoporotic Fracture2 Hip Fracture 12.2% 2.8% Population: [place] (Caucasian) Risk  Factors: None Based on Femur (Right) Neck BMD 1 -The 10-year probability of fracture may be lower than reported if the patient has received treatment. 2 -Major Osteoporotic Fracture: Clinical Spine, Forearm, Hip or Shoulder *FRAX is a Materials engineer of the [employer] for Metabolic Bone Disease, a [place] (WHO) [facility]. ASSESSMENT: The probability of a major osteoporotic fracture is 12.2% within the next ten years. The probability of a hip fracture is 2.8% within the next ten years. . Electronically Signed   By: Franki Cabot M.D.   On: 04/21/2022 14:33     Assessment and plan- Patient is a 82 y.o. female with history of right breast DCIS on Arimidex here for routine follow-up  Clinically patient is doing well with no concerning signs and symptoms of recurrence based on today's exam.  I will see him back in 6 months normal labs.  She will be completing 5 years of Arimidex in December 2024.  I reviewed the results of bone density scan with the patient which shows osteopenia of the right femur neck with a T-score of -1.3.  Her 10-year probability for major osteoporotic fracture was less than 20% and hip fracture less than 3%.  She does not require any adjuvant bisphosphonates at this time.   Visit Diagnosis 1. Encounter for follow-up surveillance of ductal carcinoma in situ (DCIS) of breast   2. Visit for monitoring Arimidex therapy  3. Osteopenia of neck of right femur      Dr. Randa Evens, MD, MPH [facility] [id number] 05/11/2022 3:01 PM

## 2022-05-13 DIAGNOSIS — I10 Essential (primary) hypertension: Secondary | ICD-10-CM | POA: Diagnosis not present

## 2022-05-13 DIAGNOSIS — E119 Type 2 diabetes mellitus without complications: Secondary | ICD-10-CM | POA: Diagnosis not present

## 2022-05-20 DIAGNOSIS — E1122 Type 2 diabetes mellitus with diabetic chronic kidney disease: Secondary | ICD-10-CM | POA: Diagnosis not present

## 2022-05-20 DIAGNOSIS — N3281 Overactive bladder: Secondary | ICD-10-CM | POA: Diagnosis not present

## 2022-05-20 DIAGNOSIS — I129 Hypertensive chronic kidney disease with stage 1 through stage 4 chronic kidney disease, or unspecified chronic kidney disease: Secondary | ICD-10-CM | POA: Diagnosis not present

## 2022-05-20 DIAGNOSIS — N1831 Chronic kidney disease, stage 3a: Secondary | ICD-10-CM | POA: Diagnosis not present

## 2022-05-20 DIAGNOSIS — Z1331 Encounter for screening for depression: Secondary | ICD-10-CM | POA: Diagnosis not present

## 2022-05-20 DIAGNOSIS — K219 Gastro-esophageal reflux disease without esophagitis: Secondary | ICD-10-CM | POA: Diagnosis not present

## 2022-05-20 DIAGNOSIS — Z0000 Encounter for general adult medical examination without abnormal findings: Secondary | ICD-10-CM | POA: Diagnosis not present

## 2022-05-21 ENCOUNTER — Encounter: Payer: Self-pay | Admitting: Radiation Oncology

## 2022-05-21 ENCOUNTER — Ambulatory Visit
Admission: RE | Admit: 2022-05-21 | Discharge: 2022-05-21 | Disposition: A | Discharge status: Home / Self Care | Payer: Medicare HMO | Source: Ambulatory Visit | Attending: Radiation Oncology | Admitting: Radiation Oncology

## 2022-05-21 VITALS — BP 127/68 | HR 63 | Temp 97.7°F | Resp 16 | Ht 64.0 in | Wt 168.0 lb

## 2022-05-21 DIAGNOSIS — D0511 Intraductal carcinoma in situ of right breast: Secondary | ICD-10-CM | POA: Insufficient documentation

## 2022-05-21 DIAGNOSIS — Z79811 Long term (current) use of aromatase inhibitors: Secondary | ICD-10-CM | POA: Diagnosis not present

## 2022-05-21 DIAGNOSIS — Z923 Personal history of irradiation: Secondary | ICD-10-CM | POA: Diagnosis not present

## 2022-05-21 DIAGNOSIS — Z170 Estrogen receptor positive status [ER+]: Secondary | ICD-10-CM | POA: Diagnosis not present

## 2022-05-21 DIAGNOSIS — C50512 Malignant neoplasm of lower-outer quadrant of left female breast: Secondary | ICD-10-CM

## 2022-05-21 NOTE — Progress Notes (Signed)
Radiation Oncology Follow up Note  Name: Susan Montgomery   Date:   05/21/2022 MRN:  [id number] DOB: [birth year]    This 82 y.o. female presents to the clinic today for over 4-year follow-up status post whole breast radiation to her right breast for ER/PR positive ductal carcinoma in situ.  REFERRING PROVIDER: Maryland Pink, MD  HPI: Patient is a 82 year old female now out over 4 years having completed whole breast radiation to her right breast for ER positive ductal carcinoma in situ.  Seen today in routine follow-up she is doing well.  She specifically denies breast tenderness cough or bone pain.  She had mammograms in November which were BI-RADS 2 benign which I have reviewed.  She is currently on.  Arimidex tolerating it well.  COMPLICATIONS OF TREATMENT: none  FOLLOW UP COMPLIANCE: keeps appointments   PHYSICAL EXAM:  BP 127/68 (BP Location: Left Arm, Patient Position: Sitting, Cuff Size: Normal)   Pulse 63   Temp 97.7 F (36.5 C) (Tympanic)   Resp 16   Ht '5\' 4"'$  (1.626 m)   Wt 168 lb (76.2 kg)   BMI 28.84 kg/m  Breast exam left breast has a subtle area in the 12 o'clock position approximately 3 cm from the nipple.  I have reviewed her mammograms no evidence of disease in that area.  Remainder of breast exam was unremarkable.  No axillary or supraclavicular adenopathy appreciated.  Well-developed well-nourished patient in NAD. HEENT reveals PERLA, EOMI, discs not visualized.  Oral cavity is clear. No oral mucosal lesions are identified. Neck is clear without evidence of cervical or supraclavicular adenopathy. Lungs are clear to A&P. Cardiac examination is essentially unremarkable with regular rate and rhythm without murmur rub or thrill. Abdomen is benign with no organomegaly or masses noted. Motor sensory and DTR levels are equal and symmetric in the upper and lower extremities. Cranial nerves II through XII are grossly intact. Proprioception is intact. No peripheral adenopathy or edema  is identified. No motor or sensory levels are noted. Crude visual fields are within normal range.  RADIOLOGY RESULTS: Mammograms reviewed compatible with above-stated findings  PLAN: At the present time patient is doing well with no evidence of disease now out over 4 years I am going to discontinue follow-up care.  She continues follow-up care with medical oncology.  I be happy to reevaluate her anytime.  She knows to call with any questions.  I would like to take this opportunity to thank you for allowing me to participate in the care of your patient.Noreene Filbert, MD

## 2022-06-29 DIAGNOSIS — H401131 Primary open-angle glaucoma, bilateral, mild stage: Secondary | ICD-10-CM | POA: Diagnosis not present

## 2022-06-29 DIAGNOSIS — Z961 Presence of intraocular lens: Secondary | ICD-10-CM | POA: Diagnosis not present

## 2022-07-28 ENCOUNTER — Other Ambulatory Visit: Payer: Self-pay | Admitting: Oncology

## 2022-11-04 ENCOUNTER — Other Ambulatory Visit: Payer: Self-pay | Admitting: Oncology

## 2022-11-13 ENCOUNTER — Encounter: Payer: Self-pay | Admitting: Oncology

## 2022-11-13 ENCOUNTER — Inpatient Hospital Stay: Payer: Medicare HMO | Attending: Oncology | Admitting: Oncology

## 2022-11-13 VITALS — BP 129/68 | HR 89 | Temp 96.7°F | Resp 18 | Ht 64.0 in | Wt 164.8 lb

## 2022-11-13 DIAGNOSIS — M85851 Other specified disorders of bone density and structure, right thigh: Secondary | ICD-10-CM | POA: Diagnosis not present

## 2022-11-13 DIAGNOSIS — Z08 Encounter for follow-up examination after completed treatment for malignant neoplasm: Secondary | ICD-10-CM

## 2022-11-13 DIAGNOSIS — Z79811 Long term (current) use of aromatase inhibitors: Secondary | ICD-10-CM | POA: Diagnosis not present

## 2022-11-13 DIAGNOSIS — D0511 Intraductal carcinoma in situ of right breast: Secondary | ICD-10-CM | POA: Insufficient documentation

## 2022-11-13 DIAGNOSIS — Z5181 Encounter for therapeutic drug level monitoring: Secondary | ICD-10-CM

## 2022-11-13 DIAGNOSIS — Z86000 Personal history of in-situ neoplasm of breast: Secondary | ICD-10-CM

## 2022-11-13 DIAGNOSIS — Z803 Family history of malignant neoplasm of breast: Secondary | ICD-10-CM | POA: Insufficient documentation

## 2022-11-13 DIAGNOSIS — L989 Disorder of the skin and subcutaneous tissue, unspecified: Secondary | ICD-10-CM | POA: Insufficient documentation

## 2022-11-13 DIAGNOSIS — Z79899 Other long term (current) drug therapy: Secondary | ICD-10-CM | POA: Insufficient documentation

## 2022-11-13 NOTE — Addendum Note (Signed)
Addended by: Corene Cornea on: 11/13/2022 02:10 PM   Modules accepted: Orders

## 2022-11-13 NOTE — Progress Notes (Signed)
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  Tumor was ER positive and patient underwent lumpectomy.  Final pathology showed high-grade DCIS with calcifications.Distance from closest anterior margin was less than 0.5 mm focally.  Extent of DCIS was at least 20 mm, grade 3.  She completed adjuvant radiation treatment and started Arimidex in December 2019.   Interval history-she is tolerating Arimidex well without any significant side effects.  She also takes her calcium and vitamin D.  Denies any breast concerns.  She is concerned about a superficial skin lesion over her right axilla.  ECOG PS- 1 Pain scale- 0   Review of systems- Review of Systems  Constitutional:  Negative for chills, fever, malaise/fatigue and weight loss.  HENT:  Negative for congestion, ear discharge and nosebleeds.   Eyes:  Negative for blurred vision.  Respiratory:  Negative for cough, hemoptysis, sputum production, shortness of breath and wheezing.   Cardiovascular:  Negative for chest pain, palpitations, orthopnea and claudication.  Gastrointestinal:  Negative for abdominal pain, blood in stool, constipation, diarrhea, heartburn, melena, nausea and vomiting.  Genitourinary:  Negative for dysuria, flank pain, frequency, hematuria and urgency.  Musculoskeletal:  Negative for back pain, joint pain and myalgias.  Skin:  Negative for rash.  Neurological:  Negative for dizziness, tingling, focal weakness, seizures, weakness and headaches.  Endo/Heme/Allergies:   Does not bruise/bleed easily.  Psychiatric/Behavioral:  Negative for depression and suicidal ideas. The patient does not have insomnia.       Allergies  Allergen Reactions   Codeine Anaphylaxis   Penicillins Anaphylaxis   Sulfa Antibiotics Swelling     Past Medical History:  Diagnosis Date   Anxiety    Breast cancer (HCC) 12/2017   Cancer (HCC)    skin   Diabetes (HCC)    Diabetes mellitus without complication (HCC)    Dyspnea    With walking fast or activity around the house   GERD (gastroesophageal reflux disease)    History of kidney stones    Hypertension    Personal history of radiation therapy      Past Surgical History:  Procedure Laterality Date   ABDOMINAL HYSTERECTOMY     ACHILLES TENDON REPAIR Right    BREAST BIOPSY Right 12/09/2017   -"coil"  clip High-grade comedo type DCIS   BREAST EXCISIONAL BIOPSY Right 12/29/2017   lumpectomy   BREAST LUMPECTOMY Right 12/29/2017   DUCTAL CARCINOMA IN SITU, HIGH-GRADE   COLONOSCOPY WITH PROPOFOL     EYE SURGERY     Cataract removal   PARTIAL MASTECTOMY WITH NEEDLE LOCALIZATION Right 12/29/2017   Procedure: PARTIAL MASTECTOMY WITH NEEDLE LOCALIZATION;  Surgeon: Carolan Shiver, MD;  Location: [facility] ORS;  Service: General;  Laterality: Right;   SKIN CANCER EXCISION     Basil Cell on the nose    Social History   Socioeconomic History   Marital status: Married    Spouse name: Not on file   Number of children: Not on file   Years of education: Not on file   Highest education level: Not on file  Occupational  History   Not on file  Tobacco Use   Smoking status: Never   Smokeless tobacco: Never  Vaping Use   Vaping status: Never Used  Substance and Sexual Activity   Alcohol use: Never   Drug use: Never   Sexual activity: Not Currently  Other Topics Concern   Not on file  Social History Narrative   Not on file   Social Determinants of Health   Financial Resource Strain: Low Risk  (05/20/2022)    Received from [facility], [facility]   Overall Financial Resource Strain (CARDIA)    Difficulty of Paying Living Expenses: Not hard at all  Food Insecurity: No Food Insecurity (05/20/2022)   Received from [facility], [facility]   Hunger Vital Sign    Worried About Running Out of Food in the Last Year: Never true    Ran Out of Food in the Last Year: Never true  Transportation Needs: No Transportation Needs (05/20/2022)   Received from [facility], [facility]   [facility] - Transportation    In the past 12 months, has lack of transportation kept you from medical appointments or from getting medications?: No    Lack of Transportation (Non-Medical): No  Physical Activity: Not on file  Stress: Not on file  Social Connections: Not on file  Intimate Partner Violence: Not on file    Family History  Problem Relation Age of Onset   Breast cancer Paternal Aunt 60   Diabetes Mother    Diabetes Sister      Current Outpatient Medications:    ACCU-CHEK GUIDE test strip, , Disp: , Rfl:    Accu-Chek Softclix Lancets lancets, , Disp: , Rfl:    acetaminophen (TYLENOL) 325 MG tablet, Take 650 mg by mouth every 6 (six) hours as needed., Disp: , Rfl:    anastrozole (ARIMIDEX) 1 MG tablet, Take 1 tablet by mouth once daily, Disp: 90 tablet, Rfl: 0   Calcium Carb-Cholecalciferol (CALCIUM-VITAMIN D) 500-400 MG-UNIT TABS, Take 1 tablet by mouth daily., Disp: , Rfl:    citalopram (CELEXA) 20 MG tablet, TAKE 1 TABLET BY MOUTH ONCE DAILY, Disp: , Rfl:    glipiZIDE (GLUCOTROL XL) 2.5 MG 24 hr tablet, Take 1 tablet by mouth daily., Disp: , Rfl:    latanoprost (XALATAN) 0.005 % ophthalmic solution, Place 1 drop into both eyes at bedtime. , Disp: , Rfl: 5   omeprazole (PRILOSEC) 20 MG capsule, Take 20 mg by mouth daily., Disp: , Rfl:    simvastatin (ZOCOR) 10 MG tablet, Take 10 mg by mouth daily. , Disp: ,  Rfl:    timolol (TIMOPTIC) 0.5 % ophthalmic solution, 1 drop daily., Disp: , Rfl:    triamterene-hydrochlorothiazide (MAXZIDE) 75-50 MG tablet, Take 1 tablet by mouth daily., Disp: , Rfl: 3  Physical exam:  Vitals:   11/13/22 1122  BP: 129/68  Pulse: 89  Resp: 18  Temp: (!) 96.7 F (35.9 C)  TempSrc: Tympanic  SpO2: 97%  Weight: 164 lb 12.8 oz (74.8 kg)  Height: 5\' 4"  (1.626 m)   Physical Exam Cardiovascular:     Rate and Rhythm: Normal rate and regular rhythm.     Heart sounds: Normal heart sounds.  Pulmonary:     Effort: Pulmonary effort is normal.     Breath sounds: Normal breath sounds.  Abdominal:     General: Bowel sounds are normal.     Palpations: Abdomen is soft.  Skin:    General: Skin is warm and dry.     Comments: There is a 3 mm mildly hyperpigmented macular lesion in the right axilla which appears to be seborrheic keratosis  Neurological:     Mental Status: She is alert and oriented to person, place, and time.    Breast exam was performed in seated and lying down position. Patient is status post right lumpectomy with a well-healed surgical scar. No evidence of any palpable masses. No evidence of axillary adenopathy. No evidence of any palpable masses or lumps in the left breast. No evidence of leftt axillary adenopathy      Latest Ref Rng & Units 04/26/2018   11:09 AM  CMP  Glucose 70 - 99 mg/dL 284   BUN 8 - 23 mg/dL 28   Creatinine 1.32 - 1.00 mg/dL 4.40   Sodium 102 - 725 mmol/L 138   Potassium 3.5 - 5.1 mmol/L 4.0   Chloride 98 - 111 mmol/L 101   CO2 22 - 32 mmol/L 27   Calcium 8.9 - 10.3 mg/dL 9.3   Total Protein 6.5 - 8.1 g/dL 7.6   Total Bilirubin 0.3 - 1.2 mg/dL 0.7   Alkaline Phos 38 - 126 U/L 60   AST 15 - 41 U/L 22   ALT 0 - 44 U/L 15       Latest Ref Rng & Units 02/23/2018   10:02 AM  CBC  WBC 4.0 - 10.5 K/uL 7.3   Hemoglobin 12.0 - 15.0 g/dL 36.6   Hematocrit 44.0 - 46.0 % 41.5   Platelets 150 - 400 K/uL 281      Assessment  and plan- Patient is a 82 y.o. female with history of right breast DCIS on Arimidex here for routine follow-up  Patient completes 5 years of Arimidex for her right breast ER positive DCIS in December 2024.  She would be due for mammogram in November 2024 which I will schedule.  Clinically patient is doing well with no concerning signs and symptoms of recurrence based on today's exam.  I will see her back in May 2025 and following that I will see her on a yearly basis as she desires to continue to follow-up with me.  Most recent bone density scan in February 2024 showed osteopenia involving the right femur neck but she does not require any adjuvant bisphosphonates for the same at this time   Visit Diagnosis 1. Encounter for follow-up surveillance of ductal carcinoma in situ (DCIS) of breast   2. Visit for monitoring Arimidex therapy   3. Osteopenia of neck of right femur      Dr. Owens Shark, MD, MPH [facility] [phone] 11/13/2022 1:08 PM

## 2022-11-25 DIAGNOSIS — I129 Hypertensive chronic kidney disease with stage 1 through stage 4 chronic kidney disease, or unspecified chronic kidney disease: Secondary | ICD-10-CM | POA: Diagnosis not present

## 2022-11-25 DIAGNOSIS — I2089 Other forms of angina pectoris: Secondary | ICD-10-CM | POA: Diagnosis not present

## 2022-11-25 DIAGNOSIS — E1122 Type 2 diabetes mellitus with diabetic chronic kidney disease: Secondary | ICD-10-CM | POA: Diagnosis not present

## 2022-11-25 DIAGNOSIS — Z23 Encounter for immunization: Secondary | ICD-10-CM | POA: Diagnosis not present

## 2022-11-25 DIAGNOSIS — E785 Hyperlipidemia, unspecified: Secondary | ICD-10-CM | POA: Diagnosis not present

## 2022-11-25 DIAGNOSIS — E119 Type 2 diabetes mellitus without complications: Secondary | ICD-10-CM | POA: Diagnosis not present

## 2022-11-25 DIAGNOSIS — N1831 Chronic kidney disease, stage 3a: Secondary | ICD-10-CM | POA: Diagnosis not present

## 2022-12-28 DIAGNOSIS — D2272 Melanocytic nevi of left lower limb, including hip: Secondary | ICD-10-CM | POA: Diagnosis not present

## 2022-12-28 DIAGNOSIS — D2261 Melanocytic nevi of right upper limb, including shoulder: Secondary | ICD-10-CM | POA: Diagnosis not present

## 2022-12-28 DIAGNOSIS — D2271 Melanocytic nevi of right lower limb, including hip: Secondary | ICD-10-CM | POA: Diagnosis not present

## 2022-12-28 DIAGNOSIS — L538 Other specified erythematous conditions: Secondary | ICD-10-CM | POA: Diagnosis not present

## 2022-12-28 DIAGNOSIS — D485 Neoplasm of uncertain behavior of skin: Secondary | ICD-10-CM | POA: Diagnosis not present

## 2022-12-28 DIAGNOSIS — L820 Inflamed seborrheic keratosis: Secondary | ICD-10-CM | POA: Diagnosis not present

## 2022-12-28 DIAGNOSIS — L821 Other seborrheic keratosis: Secondary | ICD-10-CM | POA: Diagnosis not present

## 2022-12-28 DIAGNOSIS — D0461 Carcinoma in situ of skin of right upper limb, including shoulder: Secondary | ICD-10-CM | POA: Diagnosis not present

## 2022-12-28 DIAGNOSIS — D225 Melanocytic nevi of trunk: Secondary | ICD-10-CM | POA: Diagnosis not present

## 2022-12-28 DIAGNOSIS — D2262 Melanocytic nevi of left upper limb, including shoulder: Secondary | ICD-10-CM | POA: Diagnosis not present

## 2023-01-14 ENCOUNTER — Ambulatory Visit
Admission: RE | Admit: 2023-01-14 | Discharge: 2023-01-14 | Disposition: A | Discharge status: Home / Self Care | Payer: Medicare HMO | Source: Ambulatory Visit | Attending: Oncology | Admitting: Oncology

## 2023-01-14 DIAGNOSIS — Z5181 Encounter for therapeutic drug level monitoring: Secondary | ICD-10-CM | POA: Diagnosis not present

## 2023-01-14 DIAGNOSIS — Z08 Encounter for follow-up examination after completed treatment for malignant neoplasm: Secondary | ICD-10-CM | POA: Diagnosis present

## 2023-01-14 DIAGNOSIS — Z1231 Encounter for screening mammogram for malignant neoplasm of breast: Secondary | ICD-10-CM | POA: Diagnosis not present

## 2023-01-14 DIAGNOSIS — Z86000 Personal history of in-situ neoplasm of breast: Secondary | ICD-10-CM | POA: Insufficient documentation

## 2023-01-14 DIAGNOSIS — M85851 Other specified disorders of bone density and structure, right thigh: Secondary | ICD-10-CM | POA: Insufficient documentation

## 2023-01-14 DIAGNOSIS — Z79811 Long term (current) use of aromatase inhibitors: Secondary | ICD-10-CM | POA: Insufficient documentation

## 2023-01-19 DIAGNOSIS — D0461 Carcinoma in situ of skin of right upper limb, including shoulder: Secondary | ICD-10-CM | POA: Diagnosis not present

## 2023-01-30 ENCOUNTER — Other Ambulatory Visit: Payer: Self-pay | Admitting: Oncology

## 2023-02-02 ENCOUNTER — Other Ambulatory Visit: Payer: Self-pay | Admitting: Oncology

## 2023-05-18 DIAGNOSIS — I10 Essential (primary) hypertension: Secondary | ICD-10-CM | POA: Diagnosis not present

## 2023-05-18 DIAGNOSIS — E785 Hyperlipidemia, unspecified: Secondary | ICD-10-CM | POA: Diagnosis not present

## 2023-05-18 DIAGNOSIS — E119 Type 2 diabetes mellitus without complications: Secondary | ICD-10-CM | POA: Diagnosis not present

## 2023-05-25 DIAGNOSIS — N1831 Chronic kidney disease, stage 3a: Secondary | ICD-10-CM | POA: Diagnosis not present

## 2023-05-25 DIAGNOSIS — Z170 Estrogen receptor positive status [ER+]: Secondary | ICD-10-CM | POA: Diagnosis not present

## 2023-05-25 DIAGNOSIS — C50512 Malignant neoplasm of lower-outer quadrant of left female breast: Secondary | ICD-10-CM | POA: Diagnosis not present

## 2023-05-25 DIAGNOSIS — Z0000 Encounter for general adult medical examination without abnormal findings: Secondary | ICD-10-CM | POA: Diagnosis not present

## 2023-05-25 DIAGNOSIS — E1122 Type 2 diabetes mellitus with diabetic chronic kidney disease: Secondary | ICD-10-CM | POA: Diagnosis not present

## 2023-05-25 DIAGNOSIS — I10 Essential (primary) hypertension: Secondary | ICD-10-CM | POA: Diagnosis not present

## 2023-05-25 DIAGNOSIS — E1142 Type 2 diabetes mellitus with diabetic polyneuropathy: Secondary | ICD-10-CM | POA: Diagnosis not present

## 2023-07-12 ENCOUNTER — Inpatient Hospital Stay: Payer: Medicare HMO | Attending: Oncology | Admitting: Oncology

## 2023-07-12 ENCOUNTER — Encounter: Payer: Self-pay | Admitting: Oncology

## 2023-07-12 VITALS — BP 118/64 | HR 74 | Temp 97.3°F | Resp 19 | Wt 162.4 lb

## 2023-07-12 DIAGNOSIS — Z803 Family history of malignant neoplasm of breast: Secondary | ICD-10-CM | POA: Insufficient documentation

## 2023-07-12 DIAGNOSIS — Z08 Encounter for follow-up examination after completed treatment for malignant neoplasm: Secondary | ICD-10-CM | POA: Insufficient documentation

## 2023-07-12 DIAGNOSIS — Z86000 Personal history of in-situ neoplasm of breast: Secondary | ICD-10-CM | POA: Insufficient documentation

## 2023-07-12 DIAGNOSIS — Z923 Personal history of irradiation: Secondary | ICD-10-CM | POA: Insufficient documentation

## 2023-07-12 NOTE — Progress Notes (Signed)
 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  Tumor was ER positive and patient underwent lumpectomy.  Final pathology showed high-grade DCIS with calcifications.Distance from closest anterior margin was less than 0.5 mm focally.  Extent of DCIS was at least 20 mm, grade 3.  She completed adjuvant radiation treatment and started Arimidex  in December 2019.  Patient completed 5 years of endocrine therapy in December 2024    Interval history-patient is doing well for her age.  Denies any breast concerns.  Denies any changes in her appetite or weight.  ECOG PS- 1 Pain scale- 0   Review of systems- Review of Systems  Constitutional:  Negative for chills, fever, malaise/fatigue and weight loss.  HENT:  Negative for congestion, ear discharge and nosebleeds.   Eyes:  Negative for blurred vision.  Respiratory:  Negative for cough, hemoptysis, sputum production, shortness of breath and wheezing.   Cardiovascular:  Negative for chest pain, palpitations, orthopnea and claudication.  Gastrointestinal:  Negative for abdominal pain, blood in stool, constipation, diarrhea, heartburn, melena, nausea and vomiting.  Genitourinary:  Negative for dysuria, flank pain, frequency, hematuria and urgency.  Musculoskeletal:  Negative for back pain, joint pain and myalgias.  Skin:  Negative for rash.  Neurological:  Negative for dizziness, tingling, focal weakness, seizures, weakness and headaches.  Endo/Heme/Allergies:   Does not bruise/bleed easily.  Psychiatric/Behavioral:  Negative for depression and suicidal ideas. The patient does not have insomnia.       Allergies  Allergen Reactions   Codeine Anaphylaxis   Penicillins Anaphylaxis   Sulfa Antibiotics Swelling     Past Medical History:  Diagnosis Date   Anxiety    Breast cancer (HCC) 12/2017   Cancer (HCC)    skin   Diabetes (HCC)    Diabetes mellitus without complication (HCC)    Dyspnea    With walking fast or activity around the house   GERD (gastroesophageal reflux disease)    History of kidney stones    Hypertension    Personal history of radiation therapy      Past Surgical History:  Procedure Laterality Date   ABDOMINAL HYSTERECTOMY     ACHILLES TENDON REPAIR Right    BREAST BIOPSY Right 12/09/2017   -"coil"  clip High-grade comedo type DCIS   BREAST EXCISIONAL BIOPSY Right 12/29/2017   lumpectomy   BREAST LUMPECTOMY Right 12/29/2017   DUCTAL CARCINOMA IN SITU, HIGH-GRADE   COLONOSCOPY WITH PROPOFOL      EYE SURGERY     Cataract removal   PARTIAL MASTECTOMY WITH NEEDLE LOCALIZATION Right 12/29/2017   Procedure: PARTIAL MASTECTOMY WITH NEEDLE LOCALIZATION;  Surgeon: Eldred Grego, MD;  Location: [facility] ORS;  Service: General;  Laterality: Right;   SKIN CANCER EXCISION     Basil Cell on the nose    Social History   Socioeconomic History   Marital status: Married    Spouse name: Not on file   Number of children: Not on file   Years of education: Not on file   Highest education level: Not on file  Occupational History   Not on file  Tobacco Use   Smoking status: Never   Smokeless tobacco: Never  Vaping Use   Vaping status: Never Used  Substance and Sexual Activity   Alcohol use: Never   Drug use: Never   Sexual activity: Not Currently  Other Topics Concern   Not on file  Social History Narrative   Not on file   Social Drivers of Health   Financial Resource Strain: Low Risk  (05/25/2023)    Received from [facility]   Overall Financial Resource Strain (CARDIA)    Difficulty of Paying Living Expenses: Not very hard  Food Insecurity: No Food Insecurity (05/25/2023)   Received from [facility]   Hunger Vital Sign    Worried About Running Out of Food in the Last Year: Never true    Ran Out of Food in the Last Year: Never true  Transportation Needs: No Transportation Needs (05/25/2023)   Received from [facility] - Transportation    In the past 12 months, has lack of transportation kept you from medical appointments or from getting medications?: No    Lack of Transportation (Non-Medical): No  Physical Activity: Not on file  Stress: Not on file  Social Connections: Not on file  Intimate Partner Violence: Not on file    Family History  Problem Relation Age of Onset   Breast cancer Paternal Aunt 72   Diabetes Mother    Diabetes Sister      Current Outpatient Medications:    ACCU-CHEK GUIDE test strip, , Disp: , Rfl:    Accu-Chek Softclix Lancets lancets, , Disp: , Rfl:    acetaminophen  (TYLENOL ) 325 MG tablet, Take 650 mg by mouth every 6 (six) hours as needed., Disp: , Rfl:    anastrozole  (ARIMIDEX ) 1 MG tablet, Take 1 tablet by mouth once daily, Disp: 90 tablet, Rfl: 0   Calcium Carb-Cholecalciferol (CALCIUM-VITAMIN D) 500-400 MG-UNIT TABS, Take 1 tablet by mouth daily., Disp: , Rfl:    citalopram (CELEXA) 20 MG tablet, TAKE 1 TABLET BY MOUTH ONCE DAILY, Disp: , Rfl:    glipiZIDE (GLUCOTROL XL) 2.5 MG 24 hr tablet, Take 1 tablet by mouth daily., Disp: , Rfl:    JARDIANCE 10 MG TABS tablet, Take 10 mg by mouth daily., Disp: , Rfl:    latanoprost (XALATAN) 0.005 % ophthalmic solution, Place 1 drop into both eyes at bedtime. , Disp: , Rfl: 5   omeprazole (PRILOSEC) 20 MG capsule, Take 20 mg by mouth daily., Disp: , Rfl:    simvastatin (ZOCOR) 10 MG tablet, Take 10 mg by mouth daily. , Disp: , Rfl:    timolol  (TIMOPTIC) 0.5 % ophthalmic solution, 1 drop daily., Disp: , Rfl:    triamterene-hydrochlorothiazide (MAXZIDE) 75-50 MG tablet, Take 1 tablet by mouth daily., Disp: , Rfl: 3  Physical exam:  Vitals:   07/12/23 1110  BP: 118/64  Pulse: 74  Resp: 19  Temp: (!) 97.3 F (36.3 C)  SpO2: 98%  Weight: 162 lb 6.4 oz (73.7 kg)   Physical Exam Cardiovascular:     Rate and Rhythm: Normal rate and regular rhythm.     Heart sounds: Normal heart sounds.  Pulmonary:     Effort: Pulmonary effort is normal.     Breath sounds: Normal breath sounds.  Abdominal:     General: Bowel sounds are normal.     Palpations: Abdomen is soft.  Skin:  General: Skin is warm and dry.  Neurological:     Mental Status: She is alert and oriented to person, place, and time.    Breast exam was performed in seated and lying down position. Patient is status post right lumpectomy with a well-healed surgical scar. No evidence of any palpable masses. No evidence of axillary adenopathy. No evidence of any palpable masses or lumps in the left breast. No evidence of leftt axillary adenopathy   I have personally reviewed labs listed below:    Latest Ref Rng & Units 04/26/2018   11:09 AM  CMP  Glucose 70 - 99 mg/dL 161   BUN 8 - 23 mg/dL 28   Creatinine 0.96 - 1.00 mg/dL 0.45   Sodium 409 - 811 mmol/L 138   Potassium 3.5 - 5.1 mmol/L 4.0   Chloride 98 - 111 mmol/L 101   CO2 22 - 32 mmol/L 27   Calcium 8.9 - 10.3 mg/dL 9.3   Total Protein 6.5 - 8.1 g/dL 7.6   Total Bilirubin 0.3 - 1.2 mg/dL 0.7   Alkaline Phos 38 - 126 U/L 60   AST 15 - 41 U/L 22   ALT 0 - 44 U/L 15       Latest Ref Rng & Units 02/23/2018   10:02 AM  CBC  WBC 4.0 - 10.5 K/uL 7.3   Hemoglobin 12.0 - 15.0 g/dL 91.4   Hematocrit 78.2 - 46.0 % 41.5   Platelets 150 - 400 K/uL 281       Assessment and plan- Patient is a 83 y.o. female with history of right breast DCIS ER positive s/p lumpectomy and adjuvant radiation therapy and 5 years of  endocrine therapy here for routine follow-up  Clinically patient is doing well with no concerning signs and symptoms of recurrence based on today's exam.  Her mammogram from November 2024 was unremarkable.  She has now completed 5 years of surveillance and has completed endocrine therapy as well.  She does not require follow-up with me at this time and can continue to follow-up with her primary care Dr. Laverna Pott and get yearly mammograms and breast exams.   Visit Diagnosis 1. Encounter for follow-up surveillance of ductal carcinoma in situ (DCIS) of breast      Dr. Seretha Dance, MD, MPH [facility] at [facility] [phone] 07/12/2023 3:00 PM

## 2023-07-13 DIAGNOSIS — H401131 Primary open-angle glaucoma, bilateral, mild stage: Secondary | ICD-10-CM | POA: Diagnosis not present

## 2023-07-13 DIAGNOSIS — E119 Type 2 diabetes mellitus without complications: Secondary | ICD-10-CM | POA: Diagnosis not present

## 2023-08-17 DIAGNOSIS — E1122 Type 2 diabetes mellitus with diabetic chronic kidney disease: Secondary | ICD-10-CM | POA: Diagnosis not present

## 2023-08-17 DIAGNOSIS — N1831 Chronic kidney disease, stage 3a: Secondary | ICD-10-CM | POA: Diagnosis not present

## 2023-08-24 DIAGNOSIS — I10 Essential (primary) hypertension: Secondary | ICD-10-CM | POA: Diagnosis not present

## 2023-08-24 DIAGNOSIS — E119 Type 2 diabetes mellitus without complications: Secondary | ICD-10-CM | POA: Diagnosis not present

## 2023-08-24 NOTE — Progress Notes (Signed)
 Chief Complaint: Chief Complaint  Patient presents with  . Diabetes    Subjective: Susan Montgomery is a 83 y.o. female in today for: Follow-up on her diabetes Patient comes in today for follow-up on her diabetes.  Her recent A1c was down to 6.3 with a fasting blood sugar at 129.  She has been doing some walking.  Taking her medication as directed.  No low blood sugars.  Past Medical History:  Diagnosis Date  . Arthritis   . Basal cell carcinoma    nose 2016 & back (long time ago)  . Breast cancer (CMS/HHS-HCC) 12/2017  . Colon polyp 12/04/13   TUBULAR ADENOMA AND HYPERPLASTIC  . Diabetes mellitus without complication (CMS/HHS-HCC)   . Diverticulosis 12/04/13  . Essential hypertension 04/15/2015  . GERD (gastroesophageal reflux disease)   . Glaucoma (increased eye pressure)    Dr. Dingeldein  . Hypertension   . Osteopenia    Past Surgical History:  Procedure Laterality Date  . HYSTERECTOMY  1986   for bleeding  . COLONOSCOPY  1999  . COLONOSCOPY  06/11/1998  . COLONOSCOPY  04/28/2001  . COLONOSCOPY  07/07/2005  . COLONOSCOPY  11/05/2008  . COLONOSCOPY  12/04/13   repeat 5 years per mus  . MASTECTOMY, PARTIAL Right 12/29/2017   Dr Lucas Catchings  . CATARACT EXTRACTION     right 2011, left 2014   Family History  Problem Relation Name Age of Onset  . Diabetes type II Mother    . Myocardial Infarction (Heart attack) Mother    . High blood pressure (Hypertension) Mother    . High blood pressure (Hypertension) Sister    . Kidney cancer Brother    . Diabetes type II Sister    . Hyperlipidemia (Elevated cholesterol) Sister    . Colon cancer Neg Hx    . Colon polyps Neg Hx    . Rectal cancer Neg Hx    . Ulcers Neg Hx     Current Outpatient Medications on File Prior to Visit  Medication Sig Dispense Refill  . calcium carbonate-vitamin D3 (CALTRATE 600+D) 600 mg(1,500mg ) -400 unit tablet Take 1 tablet by mouth once daily    . citalopram (CELEXA) 20 MG tablet Take 1  tablet by mouth once daily 90 tablet 3  . empagliflozin (JARDIANCE) 10 mg tablet Take 1 tablet (10 mg total) by mouth once daily 30 tablet 11  . glipiZIDE (GLUCOTROL) 2.5 MG XL tablet Take 1 tablet by mouth once daily 90 tablet 3  . latanoprost (XALATAN) 0.005 % ophthalmic solution 1 drop nightly.    SABRA omeprazole (PRILOSEC) 20 MG DR capsule Take 1 capsule (20 mg total) by mouth once daily 90 capsule 3  . simvastatin (ZOCOR) 10 MG tablet TAKE 1 TABLET BY MOUTH AT BEDTIME 90 tablet 3  . triamterene-hydroCHLOROthiazide (MAXZIDE) 75-50 mg tablet Take 1 tablet by mouth once daily 90 tablet 3  . blood glucose diagnostic (ACCU-CHEK GUIDE TEST STRIPS) test strip USE TO CHECK FASTING BLOOD SUGAR TWICE DAILY 100 each 3  . mirabegron (MYRBETRIQ) 25 mg ER Tablet Take 1 tablet (25 mg total) by mouth once daily (Patient not taking: Reported on 08/24/2023) 30 tablet 11   No current facility-administered medications on file prior to visit.    Review of Systems - 10 systems negative except as in HPI Objective: Vitals:   08/24/23 1021  BP: 138/84  Pulse: 72   Body mass index is 27.83 kg/m.  In general she is in no acute distress, alert  and oriented x 3 Lungs are clear to auscultation bilaterally Heart is regular rate and rhythm without murmurs of gallops Extremities without cyanosis clubbing edema or lesions Assessment & Plan: Diagnoses and all orders for this visit:  Essential hypertension Moderate control, continue current medication, continue to monitor Diabetes mellitus without complication (CMS/HHS-HCC) -     lancets (ACCU-CHEK SOFTCLIX LANCETS); 2 (two) times daily Use as instructed. -     Comprehensive Metabolic Panel (CMP); Future -     Hemoglobin A1C; Future Good control, continue diet and exercise, continue current medications Other orders -     Follow up in Primary Care -     Follow up in Primary Care; Future        Follow-up     Normal Orders This Visit   Follow up in Primary  Care [[id number] Custom]    Questions:   Does this order need to be coordinated with another visit or should it be hidden from the patient portal? If yes to either, the patient will need to stop by the front desk or call to schedule.: No   Who is this follow-up with?: Me   Can this appointment be overbooked by a scheduler?: No   What type of follow up is needed?: Office Visit   What's the reason for follow up?: Diabetes   Allow telemedicine?: In Person Only    Future Labs/Procedures Expected by Expires   Follow up in Primary Care [[id number] Custom]  12/22/2023 08/23/2024   Questions:     Does this order need to be coordinated with another visit or should it be hidden from the patient portal? If yes to either, the patient will need to stop by the front desk or call to schedule.: No   Who is this follow-up with?: Me   Can this appointment be overbooked by a scheduler?: No   What type of follow up is needed?: Office Visit   What's the reason for follow up?: Diabetes   Allow telemedicine?: In Person Only

## 2023-12-22 DIAGNOSIS — N1831 Chronic kidney disease, stage 3a: Secondary | ICD-10-CM | POA: Diagnosis not present

## 2023-12-22 DIAGNOSIS — E119 Type 2 diabetes mellitus without complications: Secondary | ICD-10-CM | POA: Diagnosis not present

## 2023-12-22 DIAGNOSIS — I10 Essential (primary) hypertension: Secondary | ICD-10-CM | POA: Diagnosis not present

## 2023-12-28 DIAGNOSIS — D2262 Melanocytic nevi of left upper limb, including shoulder: Secondary | ICD-10-CM | POA: Diagnosis not present

## 2023-12-28 DIAGNOSIS — B078 Other viral warts: Secondary | ICD-10-CM | POA: Diagnosis not present

## 2023-12-28 DIAGNOSIS — D2261 Melanocytic nevi of right upper limb, including shoulder: Secondary | ICD-10-CM | POA: Diagnosis not present

## 2023-12-28 DIAGNOSIS — Z85828 Personal history of other malignant neoplasm of skin: Secondary | ICD-10-CM | POA: Diagnosis not present

## 2023-12-28 DIAGNOSIS — L538 Other specified erythematous conditions: Secondary | ICD-10-CM | POA: Diagnosis not present

## 2023-12-28 DIAGNOSIS — D485 Neoplasm of uncertain behavior of skin: Secondary | ICD-10-CM | POA: Diagnosis not present

## 2023-12-28 DIAGNOSIS — R238 Other skin changes: Secondary | ICD-10-CM | POA: Diagnosis not present

## 2023-12-28 DIAGNOSIS — D2272 Melanocytic nevi of left lower limb, including hip: Secondary | ICD-10-CM | POA: Diagnosis not present

## 2023-12-28 DIAGNOSIS — D225 Melanocytic nevi of trunk: Secondary | ICD-10-CM | POA: Diagnosis not present

## 2023-12-28 DIAGNOSIS — D0439 Carcinoma in situ of skin of other parts of face: Secondary | ICD-10-CM | POA: Diagnosis not present

## 2024-01-21 ENCOUNTER — Other Ambulatory Visit: Payer: Self-pay | Admitting: Family Medicine

## 2024-01-21 DIAGNOSIS — Z1231 Encounter for screening mammogram for malignant neoplasm of breast: Secondary | ICD-10-CM

## 2024-02-01 DIAGNOSIS — E119 Type 2 diabetes mellitus without complications: Secondary | ICD-10-CM | POA: Diagnosis not present

## 2024-02-01 DIAGNOSIS — H401131 Primary open-angle glaucoma, bilateral, mild stage: Secondary | ICD-10-CM | POA: Diagnosis not present

## 2024-02-01 DIAGNOSIS — Z961 Presence of intraocular lens: Secondary | ICD-10-CM | POA: Diagnosis not present

## 2024-02-10 DIAGNOSIS — D0439 Carcinoma in situ of skin of other parts of face: Secondary | ICD-10-CM | POA: Diagnosis not present

## 2024-02-10 DIAGNOSIS — D485 Neoplasm of uncertain behavior of skin: Secondary | ICD-10-CM | POA: Diagnosis not present

## 2024-02-28 ENCOUNTER — Ambulatory Visit
Admission: RE | Admit: 2024-02-28 | Discharge: 2024-02-28 | Disposition: A | Discharge status: Home / Self Care | Source: Ambulatory Visit | Attending: Family Medicine | Admitting: Family Medicine

## 2024-02-28 DIAGNOSIS — Z1231 Encounter for screening mammogram for malignant neoplasm of breast: Secondary | ICD-10-CM | POA: Insufficient documentation
